# Patient Record
Sex: Female | Born: 1990 | Race: White | Hispanic: No | Marital: Married | Smoking: Never smoker
Health system: Southern US, Community
[De-identification: ages and names within clinical notes are randomized; demographics above are authoritative.]

## PROBLEM LIST (undated history)

## (undated) DIAGNOSIS — L700 Acne vulgaris: Secondary | ICD-10-CM

## (undated) DIAGNOSIS — K219 Gastro-esophageal reflux disease without esophagitis: Secondary | ICD-10-CM

## (undated) DIAGNOSIS — S83519A Sprain of anterior cruciate ligament of unspecified knee, initial encounter: Secondary | ICD-10-CM

## (undated) DIAGNOSIS — F32A Depression, unspecified: Secondary | ICD-10-CM

## (undated) DIAGNOSIS — B019 Varicella without complication: Secondary | ICD-10-CM

## (undated) DIAGNOSIS — Z01419 Encounter for gynecological examination (general) (routine) without abnormal findings: Secondary | ICD-10-CM

## (undated) DIAGNOSIS — F329 Major depressive disorder, single episode, unspecified: Secondary | ICD-10-CM

## (undated) DIAGNOSIS — N946 Dysmenorrhea, unspecified: Secondary | ICD-10-CM

## (undated) HISTORY — DX: Depression, unspecified: F32.A

## (undated) HISTORY — DX: Encounter for gynecological examination (general) (routine) without abnormal findings: Z01.419

## (undated) HISTORY — DX: Varicella without complication: B01.9

## (undated) HISTORY — DX: Major depressive disorder, single episode, unspecified: F32.9

## (undated) HISTORY — DX: Sprain of anterior cruciate ligament of unspecified knee, initial encounter: S83.519A

## (undated) HISTORY — DX: Dysmenorrhea, unspecified: N94.6

## (undated) HISTORY — DX: Gastro-esophageal reflux disease without esophagitis: K21.9

## (undated) HISTORY — DX: Acne vulgaris: L70.0

## (undated) HISTORY — PX: TUBAL LIGATION: SHX77

---
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
## 2008-09-26 ENCOUNTER — Telehealth: Payer: Self-pay | Admitting: Family Medicine

## 2008-10-17 ENCOUNTER — Ambulatory Visit: Payer: Self-pay | Admitting: Family Medicine

## 2008-10-17 DIAGNOSIS — L989 Disorder of the skin and subcutaneous tissue, unspecified: Secondary | ICD-10-CM

## 2008-11-18 ENCOUNTER — Encounter: Payer: Self-pay | Admitting: Family Medicine

## 2009-03-17 ENCOUNTER — Ambulatory Visit: Payer: Self-pay | Admitting: Family Medicine

## 2009-03-17 DIAGNOSIS — B019 Varicella without complication: Secondary | ICD-10-CM | POA: Insufficient documentation

## 2009-06-17 ENCOUNTER — Ambulatory Visit: Payer: Self-pay | Admitting: Family Medicine

## 2009-06-17 DIAGNOSIS — N921 Excessive and frequent menstruation with irregular cycle: Secondary | ICD-10-CM

## 2009-09-21 ENCOUNTER — Ambulatory Visit: Payer: Self-pay | Admitting: Family Medicine

## 2009-09-21 DIAGNOSIS — N390 Urinary tract infection, site not specified: Secondary | ICD-10-CM

## 2009-09-21 LAB — CONVERTED CEMR LAB
Glucose, Urine, Semiquant: NEGATIVE
Specific Gravity, Urine: 1.015
pH: 6.5

## 2009-10-09 ENCOUNTER — Ambulatory Visit: Payer: Self-pay | Admitting: Family Medicine

## 2009-10-09 LAB — CONVERTED CEMR LAB
Nitrite: NEGATIVE
Specific Gravity, Urine: 1.025
WBC Urine, dipstick: NEGATIVE

## 2009-10-13 LAB — CONVERTED CEMR LAB
ALT: 18 units/L (ref 0–35)
AST: 27 units/L (ref 0–37)
Albumin: 3.8 g/dL (ref 3.5–5.2)
Basophils Absolute: 0.1 10*3/uL (ref 0.0–0.1)
Basophils Relative: 1.6 % (ref 0.0–3.0)
CO2: 31 meq/L (ref 19–32)
GFR calc non Af Amer: 122.66 mL/min (ref >60)
Glucose, Bld: 81 mg/dL (ref 70–99)
HCT: 41.7 % (ref 36.0–46.0)
HDL: 97.5 mg/dL (ref >39.00)
Hemoglobin: 14.1 g/dL (ref 12.0–15.0)
Lymphs Abs: 1.6 10*3/uL (ref 0.7–4.0)
Monocytes Relative: 6.2 % (ref 3.0–12.0)
Neutro Abs: 1.8 10*3/uL (ref 1.4–7.7)
Potassium: 4.2 meq/L (ref 3.5–5.1)
RBC: 4.51 M/uL (ref 3.87–5.11)
RDW: 13.3 % (ref 11.5–14.6)
Sodium: 143 meq/L (ref 135–145)
TSH: 1.8 microintl units/mL (ref 0.35–5.50)
Total CHOL/HDL Ratio: 2
Total Protein: 6.7 g/dL (ref 6.0–8.3)

## 2009-11-04 ENCOUNTER — Telehealth: Payer: Self-pay | Admitting: Family Medicine

## 2009-12-28 ENCOUNTER — Ambulatory Visit: Payer: Self-pay | Admitting: Family Medicine

## 2010-02-13 ENCOUNTER — Ambulatory Visit: Payer: Self-pay | Admitting: Diagnostic Radiology

## 2010-02-13 ENCOUNTER — Emergency Department (HOSPITAL_BASED_OUTPATIENT_CLINIC_OR_DEPARTMENT_OTHER): Admission: EM | Admit: 2010-02-13 | Discharge: 2010-02-13 | Payer: Self-pay | Admitting: Emergency Medicine

## 2010-02-15 ENCOUNTER — Ambulatory Visit: Payer: Self-pay | Admitting: Family Medicine

## 2010-02-15 DIAGNOSIS — S1093XA Contusion of unspecified part of neck, initial encounter: Secondary | ICD-10-CM

## 2010-02-15 DIAGNOSIS — S060X0A Concussion without loss of consciousness, initial encounter: Secondary | ICD-10-CM | POA: Insufficient documentation

## 2010-02-15 DIAGNOSIS — S0003XA Contusion of scalp, initial encounter: Secondary | ICD-10-CM | POA: Insufficient documentation

## 2010-02-15 DIAGNOSIS — S0083XA Contusion of other part of head, initial encounter: Secondary | ICD-10-CM

## 2010-03-01 ENCOUNTER — Telehealth: Payer: Self-pay | Admitting: Family Medicine

## 2010-03-02 ENCOUNTER — Ambulatory Visit: Payer: Self-pay | Admitting: Family Medicine

## 2010-03-05 LAB — CONVERTED CEMR LAB
Basophils Absolute: 0 10*3/uL (ref 0.0–0.1)
EBV VCA IgM: 2.13 — ABNORMAL HIGH
Eosinophils Absolute: 0 10*3/uL (ref 0.0–0.7)
Lymphocytes Relative: 45.5 % (ref 12.0–46.0)
MCHC: 34.2 g/dL (ref 30.0–36.0)
MCV: 89.4 fL (ref 78.0–100.0)
Monocytes Absolute: 0.6 10*3/uL (ref 0.1–1.0)
Neutrophils Relative %: 42.2 % — ABNORMAL LOW (ref 43.0–77.0)
Platelets: 170 10*3/uL (ref 150.0–400.0)
RBC: 4.29 M/uL (ref 3.87–5.11)
RDW: 13.1 % (ref 11.5–14.6)

## 2010-03-09 ENCOUNTER — Telehealth: Payer: Self-pay | Admitting: Family Medicine

## 2010-06-08 ENCOUNTER — Ambulatory Visit: Admit: 2010-06-08 | Payer: Self-pay | Admitting: Family Medicine

## 2010-06-12 ENCOUNTER — Encounter: Payer: Self-pay | Admitting: Family Medicine

## 2010-06-15 NOTE — Assessment & Plan Note (Signed)
Summary: med check/refills/acne/cjr   Vital Signs:  Patient profile:   20 year old female Weight:      102 pounds Temp:     98.0 degrees F oral Pulse rate:   73 / minute BP sitting:   114 / 72  (left arm) Cuff size:   regular  Vitals Entered By: Alfred Levins, CMA (June 17, 2009 9:54 AM) CC: discuss meds   History of Present Illness: Here to discuss her BCP. We had started her on Tri-Sprintec several years ago for dysmenorrhea, and it worked well for a time. Last July she was established with a GYN in [facility] named Dr. Christella Hartigan, and she had a Pap smear, etc. Dr. Christella Hartigan told her to stay on the pill at that time. Now for a few months she has had some breakthrough bleeding mid way through her cycles, and she wants to switch to a new BCP. Otherwise she is doing well in college, and last semester she got straight As.   Current Medications (verified): 1)  Tri-Sprintec 0.035 Mg  Tabs (Norgestimate-Ethinyl Estradiol) .Marland Kitchen.. 1 By Mouth Once Daily 2)  Benzaclin 1-5 %  Gel (Clindamycin Phos-Benzoyl Perox) .... Apply Once Daily 3)  Elidel 1 %  Crea (Pimecrolimus) .... Apply Two Times A Day As Needed Seborrhea  Allergies (verified): 1)  ! Sulfa  Past History:  Past Medical History: Reviewed history from 03/17/2009 and no changes required. Depression GERD acne vulgaris dysmenorrhea torn ligament left foot 2008, saw Dr. Lestine Box sees Dr. Christella Hartigan in [facility] for GYN exams chickenpox several times  Past Surgical History: Reviewed history from 04/06/2007 and no changes required. Denies surgical history  Review of Systems  The patient denies anorexia, fever, weight loss, weight gain, vision loss, decreased hearing, hoarseness, chest pain, syncope, dyspnea on exertion, peripheral edema, prolonged cough, headaches, hemoptysis, abdominal pain, melena, hematochezia, severe indigestion/heartburn, hematuria, incontinence, genital sores, muscle weakness, suspicious skin lesions, transient blindness,  difficulty walking, depression, unusual weight change, abnormal bleeding, enlarged lymph nodes, angioedema, breast masses, and testicular masses.    Physical Exam  General:  Well-developed,well-nourished,in no acute distress; alert,appropriate and cooperative throughout examination Psych:  Cognition and judgment appear intact. Alert and cooperative with normal attention span and concentration. No apparent delusions, illusions, hallucinations   Impression & Recommendations:  Problem # 1:  METRORRHAGIA (ICD-626.6)  Problem # 2:  DEPRESSION (ICD-311)  Complete Medication List: 1)  Tri-sprintec 0.035 Mg Tabs (Norgestimate-ethinyl estradiol) .Marland Kitchen.. 1 by mouth once daily 2)  Benzaclin 1-5 % Gel (Clindamycin phos-benzoyl perox) .... Apply once daily 3)  Elidel 1 % Crea (Pimecrolimus) .... Apply two times a day as needed seborrhea  Patient Instructions: 1)  She needs to discuss this with her GYN.  2)  Please schedule a follow-up appointment as needed .

## 2010-06-15 NOTE — Progress Notes (Signed)
Summary: rx minocycline   Phone Note From Pharmacy   Caller: [facility]  Summary of Call: minocycline 100mg  requesting 90 days  Initial call taken by: Pura Spice, RN,  March 09, 2010 5:01 PM  Follow-up for Phone Call        call in #180 with 3 rf Follow-up by: Nelwyn Salisbury MD,  March 10, 2010 8:24 AM  Additional Follow-up for Phone Call Additional follow up Details #1::        faxed to [facility]  Additional Follow-up by: Pura Spice, RN,  March 10, 2010 9:23 AM    Prescriptions: MINOCYCLINE HCL 100 MG CAPS (MINOCYCLINE HCL) two times a day  #180 x 3   Entered by:   Pura Spice, RN   Authorized by:   Nelwyn Salisbury MD   Signed by:   Pura Spice, RN on 03/10/2010   Method used:   Faxed to ...       [facility] (mail-order)             , [place]         Ph: [phone]       Fax: [phone]   RxID:   [id number]
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
## 2010-06-15 NOTE — Assessment & Plan Note (Signed)
Summary: cpx/njr   Vital Signs:  Patient profile:   20 year old female Height:      63 inches Weight:      104 pounds BMI:     18.49 BP sitting:   80 / 50  (left arm) Cuff size:   regular  Vitals Entered By: Raechel Ache, RN (December 28, 2009 1:25 PM) CC: CPX, labs done. Sees gyn.   History of Present Illness: 20 yr old female for a cpx. She feels fine but has questions about her acne. She has been using Benzaclin cream for 4 years, and she want to try something new. This tends to dry out her skin too much.   Allergies: 1)  ! Sulfa  Past History:  Past Medical History: Reviewed history from 03/17/2009 and no changes required. Depression GERD acne vulgaris dysmenorrhea torn ligament left foot 2008, saw Dr. Lestine Box sees Dr. Christella Hartigan in [facility] for GYN exams chickenpox several times  Past Surgical History: Reviewed history from 04/06/2007 and no changes required. Denies surgical history  Family History: Reviewed history from 10/16/2007 and no changes required. allergies Family History Depression  Social History: Reviewed history from 10/16/2007 and no changes required. Single Never Smoked Alcohol use-no Drug use-no  Review of Systems  The patient denies anorexia, fever, weight loss, weight gain, vision loss, decreased hearing, hoarseness, chest pain, syncope, dyspnea on exertion, peripheral edema, prolonged cough, headaches, hemoptysis, abdominal pain, melena, hematochezia, severe indigestion/heartburn, hematuria, incontinence, genital sores, muscle weakness, suspicious skin lesions, transient blindness, difficulty walking, depression, unusual weight change, abnormal bleeding, enlarged lymph nodes, angioedema, breast masses, and testicular masses.    Physical Exam  General:  Well-developed,well-nourished,in no acute distress; alert,appropriate and cooperative throughout examination Head:  Normocephalic and atraumatic without obvious abnormalities. No apparent alopecia  or balding. Eyes:  No corneal or conjunctival inflammation noted. EOMI. Perrla. Funduscopic exam benign, without hemorrhages, exudates or papilledema. Vision grossly normal. Ears:  External ear exam shows no significant lesions or deformities.  Otoscopic examination reveals clear canals, tympanic membranes are intact bilaterally without bulging, retraction, inflammation or discharge. Hearing is grossly normal bilaterally. Nose:  External nasal examination shows no deformity or inflammation. Nasal mucosa are pink and moist without lesions or exudates. Mouth:  Oral mucosa and oropharynx without lesions or exudates.  Teeth in good repair. Neck:  No deformities, masses, or tenderness noted. Chest Wall:  No deformities, masses, or tenderness noted. Lungs:  Normal respiratory effort, chest expands symmetrically. Lungs are clear to auscultation, no crackles or wheezes. Heart:  Normal rate and regular rhythm. S1 and S2 normal without gallop, murmur, click, rub or other extra sounds. Abdomen:  Bowel sounds positive,abdomen soft and non-tender without masses, organomegaly or hernias noted. Msk:  No deformity or scoliosis noted of thoracic or lumbar spine.   Pulses:  R and L carotid,radial,femoral,dorsalis pedis and posterior tibial pulses are full and equal bilaterally Extremities:  No clubbing, cyanosis, edema, or deformity noted with normal full range of motion of all joints.   Neurologic:  No cranial nerve deficits noted. Station and gait are normal. Plantar reflexes are down-going bilaterally. DTRs are symmetrical throughout. Sensory, motor and coordinative functions appear intact. Skin:  clear except some mild papular acne over the face  Cervical Nodes:  No lymphadenopathy noted Axillary Nodes:  No palpable lymphadenopathy Inguinal Nodes:  No significant adenopathy Psych:  Cognition and judgment appear intact. Alert and cooperative with normal attention span and concentration. No apparent delusions,  illusions, hallucinations   Impression & Recommendations:  Problem # 1:  WELL ADULT EXAM (ICD-V70.0)  Complete Medication List: 1)  Zenchent 0.4-35 Mg-mcg Tabs (Norethindrone-eth estradiol) .Marland Kitchen.. 1 once daily 2)  Minocycline Hcl 100 Mg Caps (Minocycline hcl) .... Two times a day  Other Orders: Hepatitis A Vaccine (Adult Dose) (16109) Admin 1st Vaccine (60454)  Patient Instructions: 1)  Switch to oral Minocycline. Given a Hep. A shot.  2)  Please schedule a follow-up appointment in 6 months .  Prescriptions: MINOCYCLINE HCL 100 MG CAPS (MINOCYCLINE HCL) two times a day  #60 x 11   Entered and Authorized by:   Nelwyn Salisbury MD   Signed by:   Nelwyn Salisbury MD on 12/28/2009   Method used:   Electronically to        [facility] [phone]* (retail)       [address] [place], [place]  [address]       Ph: [phone]       Fax: [phone]   RxID:   [id number]    Immunization History:  Varicella Immunization History:    History of chickenpox:  yes (02/13/2009)  Immunizations Administered:  Hepatitis A Vaccine # 1:    Vaccine Type: HepA    Site: right deltoid    Mfr: GlaxoSmithKline    Dose: 0.5 ml    Route: IM    Given by: Raechel Ache, RN    Exp. Date: 09/02/2011    Lot #: [id number]    VIS given: 08/03/04 version given December 28, 2009.

## 2010-06-15 NOTE — Letter (Signed)
Summary: Excuse from Classes due to Illness  Excuse from Classes due to Illness   Imported By: Maryln Gottron 03/04/2010 10:19:41  _____________________________________________________________________  External Attachment:    Type:   Image     Comment:   External Document
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
## 2010-06-15 NOTE — Letter (Signed)
Summary: Return to Work/School  Return to Work/School   Imported By: Maryln Gottron 02/18/2010 09:44:39  _____________________________________________________________________  External Attachment:    Type:   Image     Comment:   External Document

## 2010-06-15 NOTE — Progress Notes (Signed)
Summary: no show for CPX  Phone Note Other Incoming   Summary of Call: no show for CPX Initial call taken by: Raechel Ache, RN,  November 04, 2009 10:27 AM  Follow-up for Phone Call        noted Follow-up by: Nelwyn Salisbury MD,  November 04, 2009 11:50 AM

## 2010-07-02 ENCOUNTER — Ambulatory Visit (INDEPENDENT_AMBULATORY_CARE_PROVIDER_SITE_OTHER): Payer: Self-pay | Admitting: Family Medicine

## 2010-07-02 DIAGNOSIS — Z0000 Encounter for general adult medical examination without abnormal findings: Secondary | ICD-10-CM

## 2010-07-02 DIAGNOSIS — Z23 Encounter for immunization: Secondary | ICD-10-CM

## 2010-07-06 ENCOUNTER — Ambulatory Visit: Payer: Self-pay | Admitting: Family Medicine

## 2010-10-01 NOTE — Assessment & Plan Note (Signed)
[facility] OFFICE NOTE   NAME:Bonnie Gutierrez, Bonnie Gutierrez                  MRN:          [id number]  DATE:08/10/2006                            DOB:          1990-10-29    This is a 20 year old girl here with her mother for a complete physical  examination.  In general, she is doing well, but does have one  complaint, and that is with her skin.  She has had mild facial acne for  the past 3 years, but over the past 6 months, it has become more of a  problem for her.  She is cleansing with over-the-counter acne cleansers  twice daily.  Since changing her diet a bit from a year ago, she no  longer has problems with acid reflux at all.  She no longer has problems  with depression or grief (the whole family is dealing with the death of  a relative late last year as noted in the chart).  She is excited  because in June she will be spending a week in [place] with her father on  a mission truck.   PAST MEDICAL HISTORY/FAMILY HISTORY/SOCIAL HISTORY/HABITS, ETC.:  For  further details, I refer you to our last physical note dated September 07, 2005.   ALLERGIES:  SULFA.   CURRENT MEDICATIONS:  1. Ortho Tri-Cyclen daily.  2. Multivitamin daily.   OBJECTIVE:  VITAL SIGNS:  Height 5 feet, 3 inches.  Weight 100.  Blood  pressure 94/62, pulse 80 and regular.  GENERAL:  She appears to be healthy.  SKIN:  Clear, except for some mild follicular acne on the forehead and  the face.  HEENT:  Eyes - clear sclerae.  Oropharynx clear.  NECK:  Supple without lymphadenopathy or masses.  LUNGS:  Clear.  CARDIAC:  Rate and rhythm regular without gallops, murmurs or rubs.  Distals pulses are full.  ABDOMEN:  Soft, normal bowel sounds, nontender, no masses.  EXTREMITIES:  No clubbing, cyanosis, or edema.  NEUROLOGIC:  Grossly intact.   ASSESSMENT AND PLAN:  1. Complete physical.  She seems to be doing quite well.  2. History of grief  reaction and depression, now resolved.  3. Gastroesophageal reflux disease, controlled with diet alone.  4. Facial acne.  Will begin BenzaClin gel b.i.d.     Tera Mater. Clent Ridges, MD  Electronically Signed   SAF/MedQ  DD: 08/10/2006  DT: 08/10/2006  Job #: [id number]

## 2011-04-01 ENCOUNTER — Telehealth: Payer: Self-pay | Admitting: Family Medicine

## 2011-04-01 NOTE — Telephone Encounter (Signed)
Pt was in [facility] this a.m on 04/01/11. Pt now has back and neck pain and a pinching feeling in upper back. Pt is req an ov to be seen or advice as to what she should do. Pls call.

## 2011-04-01 NOTE — Telephone Encounter (Signed)
Pt will go to the ER if she feels no better this weekend for xrays and evaluations.

## 2011-04-01 NOTE — Telephone Encounter (Signed)
agreed

## 2011-04-25 ENCOUNTER — Ambulatory Visit (INDEPENDENT_AMBULATORY_CARE_PROVIDER_SITE_OTHER): Payer: 59 | Admitting: Family Medicine

## 2011-04-25 DIAGNOSIS — Z0000 Encounter for general adult medical examination without abnormal findings: Secondary | ICD-10-CM

## 2011-04-30 IMAGING — CR DG NASAL BONES 3+V
3 series · 3 of 3 positions shown · non-contrast
Comparison: None.

CLINICAL DATA: Nasal injury.  Nasal pain and swelling.

NASAL BONES - 3+ VIEW

[w waters *]
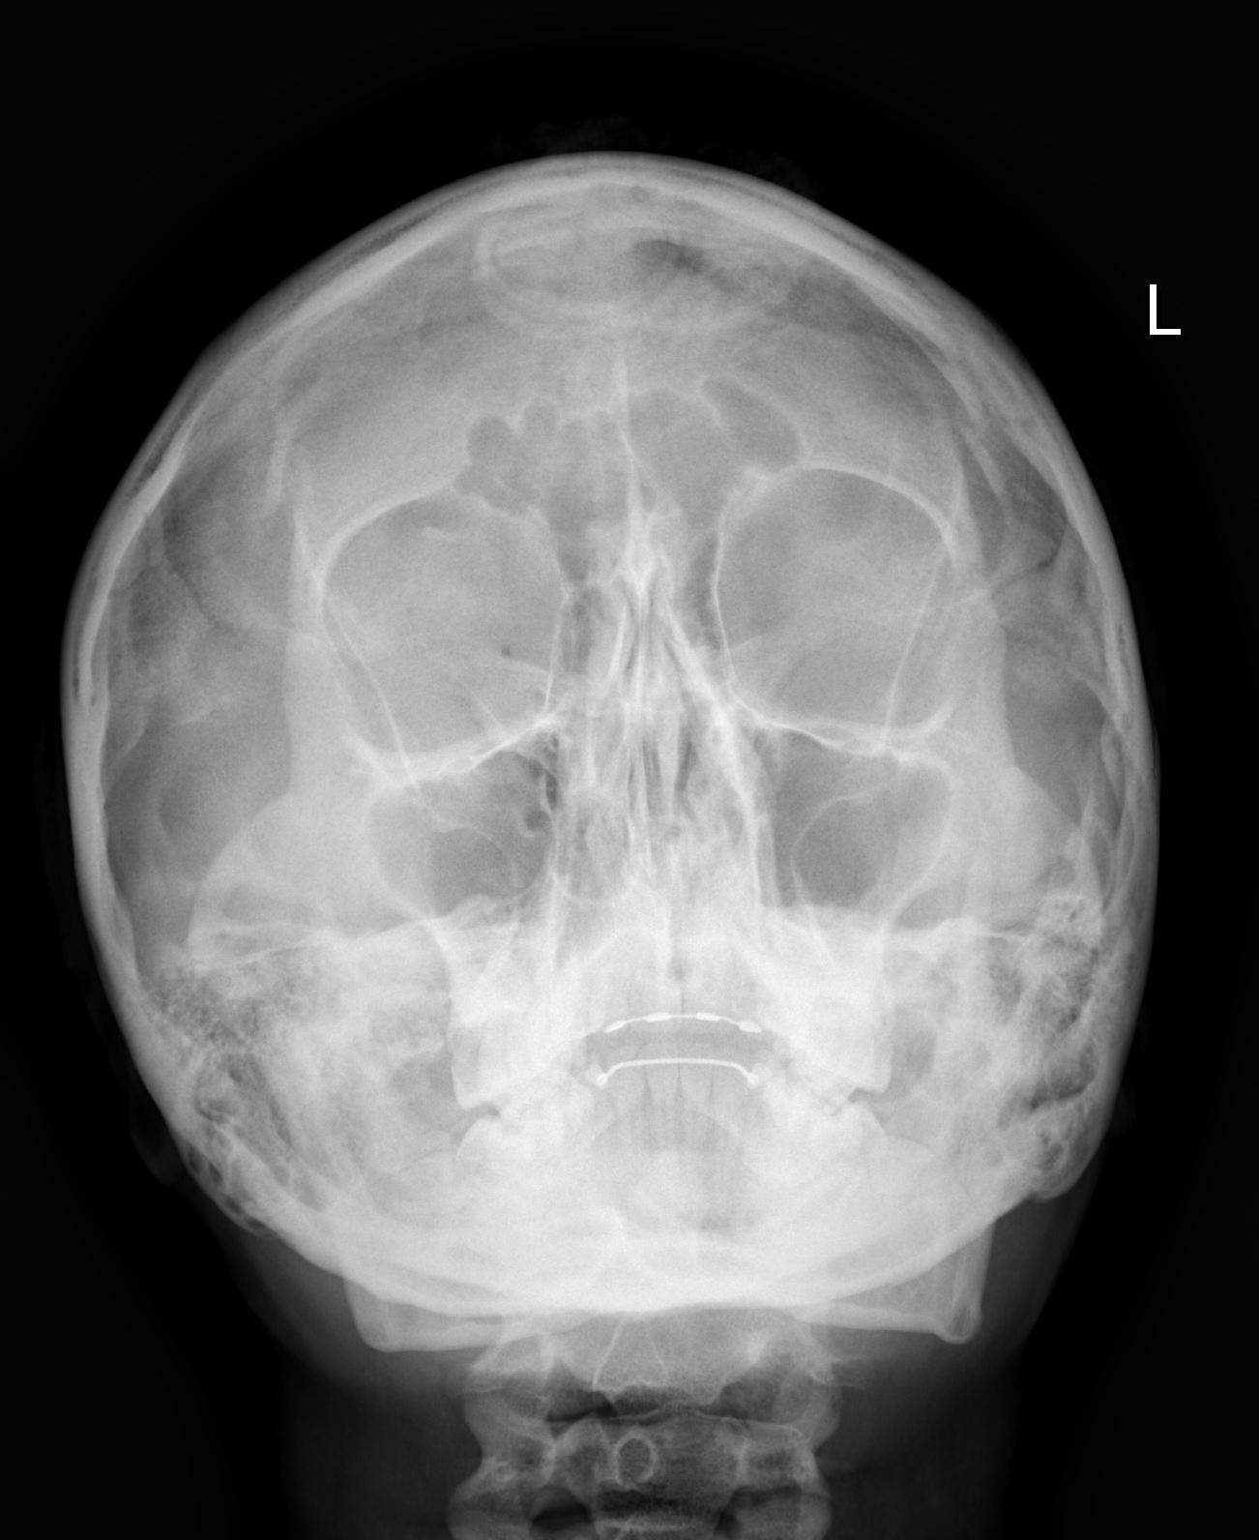

[w nasal bone lat * (1 of 2)]
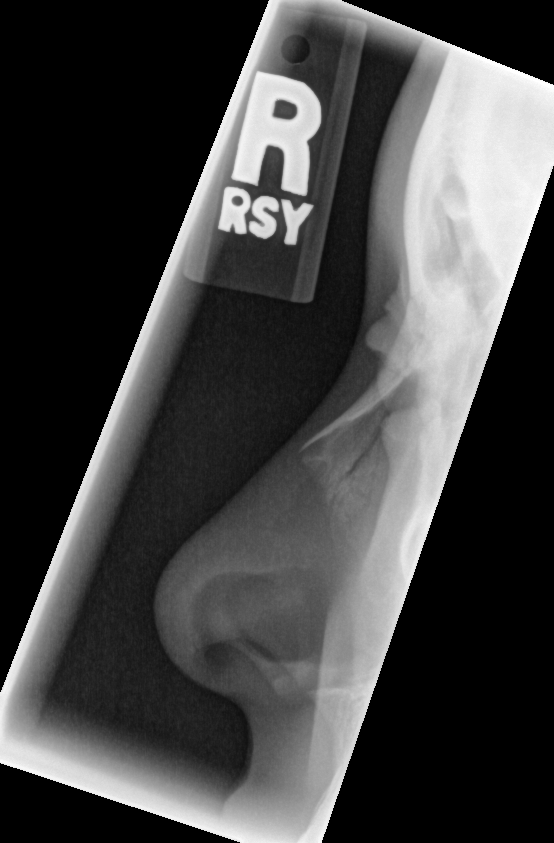

[w nasal bone lat * (2 of 2)]
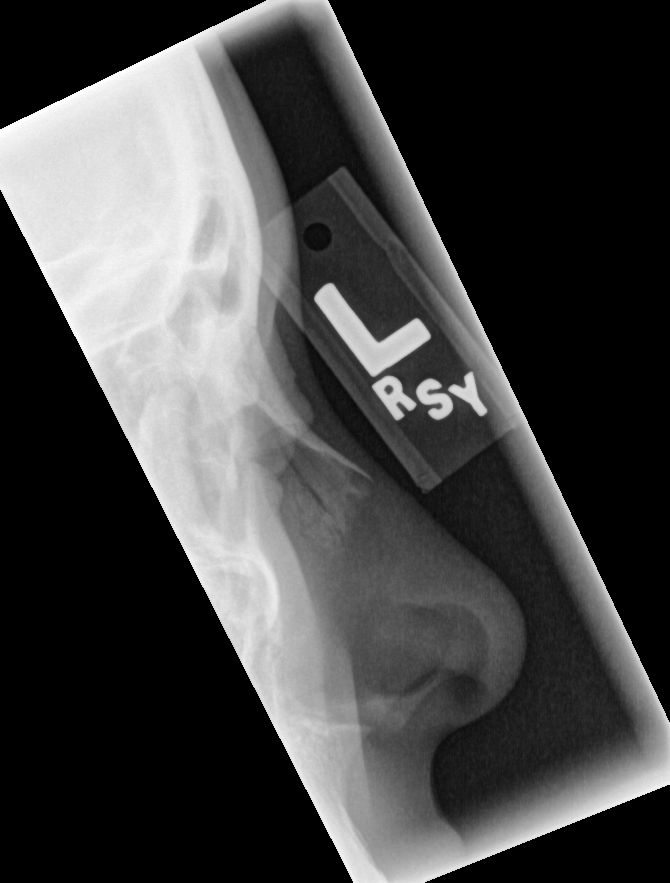

[3 of 3 positions shown; findings below may reference images not displayed]

FINDINGS: There is no evidence of fracture or other bone
abnormality.
IMPRESSION: Negative.

## 2011-07-12 ENCOUNTER — Encounter: Payer: Self-pay | Admitting: Family Medicine

## 2011-07-12 ENCOUNTER — Ambulatory Visit (INDEPENDENT_AMBULATORY_CARE_PROVIDER_SITE_OTHER): Payer: BC Managed Care – PPO | Admitting: Family Medicine

## 2011-07-12 VITALS — BP 98/62 | HR 76 | Temp 98.7°F | Wt 114.0 lb

## 2011-07-12 DIAGNOSIS — J329 Chronic sinusitis, unspecified: Secondary | ICD-10-CM

## 2011-07-12 MED ORDER — AZITHROMYCIN 250 MG PO TABS: ORAL_TABLET | ORAL | Status: AC

## 2011-07-12 NOTE — Progress Notes (Signed)
  Subjective:    Patient ID: Bonnie Gutierrez, female    DOB: 1991/02/10, 21 y.o.   MRN: [id number]  HPI Here for 10 days of sinus pressure, PND, HA, ST,and a dry cough. No fever.    Review of Systems  Constitutional: Negative.   HENT: Positive for congestion and postnasal drip.   Eyes: Negative.   Respiratory: Positive for cough.        Objective:   Physical Exam  Constitutional: She appears well-developed and well-nourished.  HENT:  Right Ear: External ear normal.  Left Ear: External ear normal.  Nose: Nose normal.  Mouth/Throat: Oropharynx is clear and moist. No oropharyngeal exudate.  Eyes: Conjunctivae are normal.  Pulmonary/Chest: Breath sounds normal. No respiratory distress. She has no wheezes. She has no rales.  Lymphadenopathy:    She has no cervical adenopathy.          Assessment & Plan:  Add Mucinex prn

## 2011-08-08 ENCOUNTER — Telehealth: Payer: Self-pay | Admitting: Family Medicine

## 2011-08-08 MED ORDER — PREDNISONE (PAK) 10 MG PO TABS: 10.0000 mg | ORAL_TABLET | Freq: Every day | ORAL | Status: AC

## 2011-08-08 NOTE — Telephone Encounter (Signed)
Pt is allergic to sulfur and used a face cream on Thursday that had sulfur in it and broke out in hives all over her face. Pt has been using benadryl since Friday and it is not helping.

## 2011-08-08 NOTE — Telephone Encounter (Signed)
Using hydrocortisone cream and Benadryl 25 mg. q 6 hours with no improvements.  Hives on face, chest and neck.

## 2011-08-08 NOTE — Telephone Encounter (Signed)
Script sent e-scribe and spoke with pt. 

## 2011-08-08 NOTE — Telephone Encounter (Signed)
Call in a 12 days taper pack of prednisone 10 mg tablets, #48

## 2012-04-30 ENCOUNTER — Ambulatory Visit: Payer: BC Managed Care – PPO | Admitting: Family Medicine

## 2012-04-30 DIAGNOSIS — J111 Influenza due to unidentified influenza virus with other respiratory manifestations: Secondary | ICD-10-CM

## 2012-05-07 ENCOUNTER — Encounter: Payer: Self-pay | Admitting: Family Medicine

## 2012-05-07 ENCOUNTER — Ambulatory Visit (INDEPENDENT_AMBULATORY_CARE_PROVIDER_SITE_OTHER): Payer: BC Managed Care – PPO | Admitting: Family Medicine

## 2012-05-07 VITALS — BP 124/80 | HR 113 | Temp 99.2°F | Wt 116.0 lb

## 2012-05-07 DIAGNOSIS — B9789 Other viral agents as the cause of diseases classified elsewhere: Secondary | ICD-10-CM

## 2012-05-07 DIAGNOSIS — B349 Viral infection, unspecified: Secondary | ICD-10-CM

## 2012-05-07 MED ORDER — ALBUTEROL SULFATE (5 MG/ML) 0.5% IN NEBU
2.5000 mg | INHALATION_SOLUTION | RESPIRATORY_TRACT | Status: AC
  Administered 2012-05-07: 2.5 mg via RESPIRATORY_TRACT

## 2012-05-07 MED ORDER — BENZONATATE 100 MG PO CAPS: 100.0000 mg | ORAL_CAPSULE | Freq: Two times a day (BID) | ORAL | Status: DC | PRN

## 2012-05-07 MED ORDER — ALBUTEROL SULFATE HFA 108 (90 BASE) MCG/ACT IN AERS: 2.0000 | INHALATION_SPRAY | Freq: Four times a day (QID) | RESPIRATORY_TRACT | Status: DC | PRN

## 2012-05-07 NOTE — Patient Instructions (Addendum)
-  please drink plenty of fluids  -use humidifier at night  -use cough medication if needed and albuterol if needed and if helps  -follow up with trouble breathing or not feeling better in 1 week

## 2012-05-07 NOTE — Progress Notes (Signed)
Chief Complaint  Patient presents with  . Cough    HPI:  Acute visit for cough/flu: -started: 1 week ago, dx with flu based on symptom and started on tamiflu and codeine -ran out of cough syrup -symptoms:fever went away, but cough as remained -denies:fever, SOB, NVD, tooth pain -has tried: tamiflu -sick contacts: none know -Hx of:had flu recently, no hx asthma  ROS: See pertinent positives and negatives per HPI.  Past Medical History  Diagnosis Date  . Depression   . GERD (gastroesophageal reflux disease)   . Acne vulgaris   . Dysmenorrhea   . Torn ACL (anterior cruciate ligament)     left foot Dr Lestine Box  2008  . Chicken pox     Family History  Problem Relation Age of Onset  . Allergies      family hx  . Depression      family hx    History   Social History  . Marital Status: Single    Spouse Name: N/A    Number of Children: N/A  . Years of Education: N/A   Social History Main Topics  . Smoking status: Never Smoker   . Smokeless tobacco: Never Used  . Alcohol Use: Yes     Comment: once a month or less  . Drug Use: No  . Sexually Active: None   Other Topics Concern  . None   Social History Narrative  . None    Current outpatient prescriptions:Multiple Vitamin (MULTIVITAMIN) tablet, Take 1 tablet by mouth daily., Disp: , Rfl: ;  norethindrone-ethinyl estradiol (OVCON) 0.4-35 MG-MCG per tablet, Take 1 tablet by mouth daily.  , Disp: , Rfl: ;  albuterol (PROVENTIL HFA;VENTOLIN HFA) 108 (90 BASE) MCG/ACT inhaler, Inhale 2 puffs into the lungs every 6 (six) hours as needed for wheezing., Disp: 1 Inhaler, Rfl: 0 benzonatate (TESSALON) 100 MG capsule, Take 1 capsule (100 mg total) by mouth 2 (two) times daily as needed for cough., Disp: 20 capsule, Rfl: 0  EXAM:  Filed Vitals:   05/07/12 1446  BP:   Pulse: 113  Temp:    O2 94% There is no height on file to calculate BMI.  GENERAL: vitals reviewed and listed above, alert, oriented, appears well  hydrated and in no acute distress  HEENT: atraumatic, conjunttiva clear, no obvious abnormalities on inspection of external nose and ears  NECK: no obvious masses on inspection  LUNGS: clear to auscultation bilaterally, no wheezes, rales or rhonchi, good air movement  CV: HRRR, no peripheral edema  MS: moves all extremities without noticeable abnormality  PSYCH: pleasant and cooperative, no obvious depression or anxiety  ASSESSMENT AND PLAN:  Discussed the following assessment and plan:  1. Viral illness    -pt initially coughing so much when pulse ox taken and in low nineties, nursing staff gave nebulizer tx and pt felt much better with less coughing and O2 sats normal -lung clear on exam, advised on typically course of flu and return precuations -pt doesn't want more codeine as made her heart rate go up - tessalon and alb provided to use prn -Patient advised to return or notify a doctor immediately if symptoms worsen or persist or new concerns arise.  Patient Instructions  -please drink plenty of fluids  -use humidifier at night  -use cough medication if needed and albuterol if needed and if helps  -follow up with trouble breathing or not feeling better in 1 week     KIM, HANNAH R.

## 2012-05-25 ENCOUNTER — Encounter: Payer: Self-pay | Admitting: Family Medicine

## 2012-05-25 ENCOUNTER — Ambulatory Visit (INDEPENDENT_AMBULATORY_CARE_PROVIDER_SITE_OTHER): Payer: BC Managed Care – PPO | Admitting: Family Medicine

## 2012-05-25 VITALS — BP 124/70 | HR 118 | Temp 98.6°F | Wt 112.0 lb

## 2012-05-25 DIAGNOSIS — J069 Acute upper respiratory infection, unspecified: Secondary | ICD-10-CM

## 2012-05-25 MED ORDER — METHYLPREDNISOLONE 4 MG PO KIT: PACK | ORAL | Status: AC

## 2012-05-25 NOTE — Progress Notes (Signed)
  Subjective:    Patient ID: Bonnie Gutierrez, female    DOB: Sep 14, 1990, 22 y.o.   MRN: [id number]  HPI Here for a stubborn cough that has bothered her for 4 weeks now. She was seen here several weeks ago and was told she had a viral URI. She has been taking OTC decongestants and cough meds with mixed results. She is using a Ventolin inhaler several times a day. This helps a great deal but lasts only an hour or so. The cough is dry, non-productive, and she has soft wheezes at times. No fever.    Review of Systems  Constitutional: Negative.   HENT: Negative.   Eyes: Negative.   Respiratory: Positive for cough, shortness of breath and wheezing.   Cardiovascular: Negative.        Objective:   Physical Exam  Constitutional: She appears well-developed and well-nourished.  HENT:  Right Ear: External ear normal.  Left Ear: External ear normal.  Nose: Nose normal.  Mouth/Throat: Oropharynx is clear and moist.  Eyes: Conjunctivae normal are normal.  Neck: No thyromegaly present.  Pulmonary/Chest: Effort normal and breath sounds normal. No respiratory distress. She has no wheezes. She has no rales.  Lymphadenopathy:    She has no cervical adenopathy.          Assessment & Plan:  Viral URI. Try a Medrol dose pack to reduce airway inflammation. This should resolve soon

## 2012-11-05 ENCOUNTER — Ambulatory Visit (INDEPENDENT_AMBULATORY_CARE_PROVIDER_SITE_OTHER): Payer: BC Managed Care – PPO | Admitting: Family Medicine

## 2012-11-05 ENCOUNTER — Encounter: Payer: Self-pay | Admitting: Family Medicine

## 2012-11-05 VITALS — BP 110/64 | HR 103 | Temp 98.2°F | Wt 119.0 lb

## 2012-11-05 DIAGNOSIS — O0001 Abdominal pregnancy with intrauterine pregnancy: Secondary | ICD-10-CM

## 2012-11-05 LAB — POCT URINALYSIS DIPSTICK
Blood, UA: NEGATIVE
pH, UA: 6

## 2012-11-05 NOTE — Addendum Note (Signed)
Addended by: Aniceto Boss A on: 11/05/2012 11:44 AM   Modules accepted: Orders

## 2012-11-05 NOTE — Progress Notes (Signed)
  Subjective:    Patient ID: Bonnie Gutierrez, female    DOB: November 26, 1990, 22 y.o.   MRN: [id number]  HPI Here to check a pregnancy test. She is here with her boyfriend. Her LMP was 10-05-12. She feels fine although her breasts are a little tender. She has had a positive test at home. She does not use alcohol or tobacco.    Review of Systems  Constitutional: Negative.   Respiratory: Negative.   Cardiovascular: Negative.        Objective:   Physical Exam  Constitutional: She appears well-developed and well-nourished.  Cardiovascular: Normal rate, regular rhythm, normal heart sounds and intact distal pulses.   Pulmonary/Chest: Effort normal and breath sounds normal.          Assessment & Plan:  Positive pregnancy. She has already made an appt to see her ObGyn on 11-28-12. She will start on prenatal vitamins.

## 2013-02-13 ENCOUNTER — Encounter: Payer: Self-pay | Admitting: Family Medicine

## 2013-02-13 ENCOUNTER — Ambulatory Visit (INDEPENDENT_AMBULATORY_CARE_PROVIDER_SITE_OTHER): Payer: BC Managed Care – PPO | Admitting: Family Medicine

## 2013-02-13 VITALS — BP 100/60 | HR 87 | Temp 98.6°F | Wt 128.0 lb

## 2013-02-13 DIAGNOSIS — L0292 Furuncle, unspecified: Secondary | ICD-10-CM

## 2013-02-13 NOTE — Progress Notes (Signed)
  Subjective:    Patient ID: Bonnie Gutierrez, female    DOB: 1990/08/17, 22 y.o.   MRN: [id number]  HPI Here for 3 days of a red lesion on the chest. Yesterday it became tender and swelled up, then it opened and drained yellow fluid. Today it looks and feels much better and appears to be going away. Applying Neosporin ointment. No fever. She is [redacted] weeks pregnant, and her pregnancy is going very well.    Review of Systems  Constitutional: Negative.        Objective:   Physical Exam  Constitutional: She appears well-developed and well-nourished.  Skin:  The left upper chest has a slightly raised red papular lesion that has scabbed over. It is not warm or tender          Assessment & Plan:  Small boil that seems to be resolving on its own. Continue Neosporin. Recheck if it gets worse again

## 2014-02-21 ENCOUNTER — Ambulatory Visit (INDEPENDENT_AMBULATORY_CARE_PROVIDER_SITE_OTHER): Payer: BC Managed Care – PPO | Admitting: Family Medicine

## 2014-02-21 DIAGNOSIS — Z23 Encounter for immunization: Secondary | ICD-10-CM

## 2014-03-17 ENCOUNTER — Encounter: Payer: Self-pay | Admitting: Family Medicine

## 2014-03-26 ENCOUNTER — Encounter: Payer: Self-pay | Admitting: Family Medicine

## 2014-03-26 ENCOUNTER — Ambulatory Visit (INDEPENDENT_AMBULATORY_CARE_PROVIDER_SITE_OTHER): Payer: BC Managed Care – PPO | Admitting: Family Medicine

## 2014-03-26 VITALS — BP 99/57 | HR 54 | Temp 98.5°F | Ht 63.0 in | Wt 134.0 lb

## 2014-03-26 DIAGNOSIS — Z0000 Encounter for general adult medical examination without abnormal findings: Secondary | ICD-10-CM

## 2014-03-26 LAB — CBC WITH DIFFERENTIAL/PLATELET
Basophils Absolute: 0 10*3/uL (ref 0.0–0.1)
Basophils Relative: 0.6 % (ref 0.0–3.0)
EOS ABS: 0.1 10*3/uL (ref 0.0–0.7)
Eosinophils Relative: 0.8 % (ref 0.0–5.0)
HCT: 43.3 % (ref 36.0–46.0)
Hemoglobin: 14.4 g/dL (ref 12.0–15.0)
Lymphocytes Relative: 29.9 % (ref 12.0–46.0)
Lymphs Abs: 2 10*3/uL (ref 0.7–4.0)
MCHC: 33.3 g/dL (ref 30.0–36.0)
MCV: 87.3 fl (ref 78.0–100.0)
MONO ABS: 0.4 10*3/uL (ref 0.1–1.0)
Monocytes Relative: 6.7 % (ref 3.0–12.0)
NEUTROS PCT: 62 % (ref 43.0–77.0)
Neutro Abs: 4.1 10*3/uL (ref 1.4–7.7)
PLATELETS: 212 10*3/uL (ref 150.0–400.0)
RBC: 4.96 Mil/uL (ref 3.87–5.11)
RDW: 12.9 % (ref 11.5–15.5)
WBC: 6.6 10*3/uL (ref 4.0–10.5)

## 2014-03-26 LAB — TSH: TSH: 0.98 u[IU]/mL (ref 0.35–4.50)

## 2014-03-26 LAB — HEPATIC FUNCTION PANEL
ALBUMIN: 3.6 g/dL (ref 3.5–5.2)
ALK PHOS: 65 U/L (ref 39–117)
ALT: 16 U/L (ref 0–35)
AST: 21 U/L (ref 0–37)
Bilirubin, Direct: 0.1 mg/dL (ref 0.0–0.3)
TOTAL PROTEIN: 7.4 g/dL (ref 6.0–8.3)
Total Bilirubin: 0.7 mg/dL (ref 0.2–1.2)

## 2014-03-26 LAB — POCT URINALYSIS DIPSTICK
Bilirubin, UA: NEGATIVE
GLUCOSE UA: NEGATIVE
Leukocytes, UA: NEGATIVE
NITRITE UA: NEGATIVE
PH UA: 5.5
PROTEIN UA: NEGATIVE
Spec Grav, UA: 1.01
UROBILINOGEN UA: 0.2

## 2014-03-26 LAB — BASIC METABOLIC PANEL
BUN: 10 mg/dL (ref 6–23)
CALCIUM: 9.2 mg/dL (ref 8.4–10.5)
CO2: 25 meq/L (ref 19–32)
Chloride: 105 mEq/L (ref 96–112)
Creatinine, Ser: 0.8 mg/dL (ref 0.4–1.2)
GFR: 92.77 mL/min (ref >60.00)
GLUCOSE: 87 mg/dL (ref 70–99)
Potassium: 4.6 mEq/L (ref 3.5–5.1)
SODIUM: 137 meq/L (ref 135–145)

## 2014-03-26 LAB — LIPID PANEL
CHOLESTEROL: 175 mg/dL (ref 0–200)
HDL: 68.4 mg/dL (ref >39.00)
LDL Cholesterol: 94 mg/dL (ref 0–99)
NonHDL: 106.6
Total CHOL/HDL Ratio: 3
Triglycerides: 61 mg/dL (ref 0.0–149.0)
VLDL: 12.2 mg/dL (ref 0.0–40.0)

## 2014-03-26 NOTE — Progress Notes (Signed)
Pre visit review using our clinic review tool, if applicable. No additional management support is needed unless otherwise documented below in the visit note. 

## 2014-03-26 NOTE — Progress Notes (Signed)
   Subjective:    Patient ID: Bonnie Gutierrez, female    DOB: 07-21-1990, 23 y.o.   MRN: [id number]  HPI 23 yr old female for a cpx. She feels well. 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    Review of Systems  Constitutional: Negative.   HENT: Negative.   Eyes: Negative.   Respiratory: Negative.   Cardiovascular: Negative.   Gastrointestinal: Negative.   Genitourinary: Negative for dysuria, urgency, frequency, hematuria, flank pain, decreased urine volume, enuresis, difficulty urinating, pelvic pain and dyspareunia.  Musculoskeletal: Negative.   Skin: Negative.   Neurological: Negative.   Psychiatric/Behavioral: Negative.        Objective:   Physical Exam  Constitutional: She is oriented to person, place, and time. She appears well-developed and well-nourished. No distress.  HENT:  Head: Normocephalic and atraumatic.  Right Ear: External ear normal.  Left Ear: External ear normal.  Nose: Nose normal.  Mouth/Throat: Oropharynx is clear and moist. No oropharyngeal exudate.  Eyes: Conjunctivae and EOM are normal. Pupils are equal, round, and reactive to light. No scleral icterus.  Neck: Normal range of motion. Neck supple. No JVD present. No thyromegaly present.  Cardiovascular: Normal rate, regular rhythm, normal heart sounds and intact distal pulses.  Exam reveals no gallop and no friction rub.   No murmur heard. Pulmonary/Chest: Effort normal and breath sounds normal. No respiratory distress. She has no wheezes. She has no rales. She exhibits no tenderness.  Abdominal: Soft. Bowel sounds are normal. She exhibits no distension and no mass. There is no tenderness. There is no rebound and no guarding.  Musculoskeletal: Normal range of motion. She exhibits no edema or tenderness.  Lymphadenopathy:    She has no cervical adenopathy.  Neurological: She is alert and oriented to person, place, and time. She has normal reflexes. No cranial nerve deficit. She  exhibits normal muscle tone. Coordination normal.  Skin: Skin is warm and dry. No rash noted. No erythema.  Psychiatric: She has a normal mood and affect. Her behavior is normal. Judgment and thought content normal.          Assessment & Plan:  Well exam.

## 2014-07-14 ENCOUNTER — Telehealth: Payer: Self-pay | Admitting: *Deleted

## 2014-07-14 ENCOUNTER — Ambulatory Visit (INDEPENDENT_AMBULATORY_CARE_PROVIDER_SITE_OTHER): Payer: BC Managed Care – PPO | Admitting: Family Medicine

## 2014-07-14 ENCOUNTER — Encounter: Payer: Self-pay | Admitting: Family Medicine

## 2014-07-14 VITALS — BP 100/60 | Temp 98.2°F | Wt 135.6 lb

## 2014-07-14 DIAGNOSIS — J0100 Acute maxillary sinusitis, unspecified: Secondary | ICD-10-CM

## 2014-07-14 MED ORDER — AZITHROMYCIN 250 MG PO TABS: ORAL_TABLET | ORAL | Status: DC

## 2014-07-14 NOTE — Telephone Encounter (Signed)
Pt called stating her eye has been "goopy" and Dr Clent RidgesFry told her that it may be from the sinus infection, pt stated she woke up from a nap and her eye was stuck shut with green goopy stuff. Pt is concerned if there is anything else she needs to be doing to keep it clean. Please advise

## 2014-07-14 NOTE — Progress Notes (Signed)
Pre visit review using our clinic review tool, if applicable. No additional management support is needed unless otherwise documented below in the visit note. 

## 2014-07-14 NOTE — Progress Notes (Signed)
   Subjective:    Patient ID: Bonnie Gutierrez, female    DOB: 1990/12/21, 24 y.o.   MRN: [id number]  HPI Here for one week of sinus pressure, PND, red eyes, ST, and a dry cough.    Review of Systems  Constitutional: Negative.   HENT: Positive for congestion, postnasal drip and sinus pressure.   Eyes: Positive for redness. Negative for discharge and visual disturbance.  Respiratory: Positive for cough.        Objective:   Physical Exam  Constitutional: She appears well-developed and well-nourished.  HENT:  Right Ear: External ear normal.  Left Ear: External ear normal.  Nose: Nose normal.  Mouth/Throat: Oropharynx is clear and moist.  Eyes:  Left conjunctiva is pink, right is clear   Pulmonary/Chest: Effort normal and breath sounds normal.  Lymphadenopathy:    She has no cervical adenopathy.          Assessment & Plan:  Add Mucinex D

## 2014-07-14 NOTE — Telephone Encounter (Signed)
Seen in office.

## 2014-10-10 ENCOUNTER — Encounter: Payer: Self-pay | Admitting: Family Medicine

## 2014-10-10 ENCOUNTER — Ambulatory Visit (INDEPENDENT_AMBULATORY_CARE_PROVIDER_SITE_OTHER): Payer: BC Managed Care – PPO | Admitting: Family Medicine

## 2014-10-10 VITALS — BP 98/66 | HR 73 | Temp 98.4°F | Ht 63.0 in | Wt 131.0 lb

## 2014-10-10 DIAGNOSIS — H109 Unspecified conjunctivitis: Secondary | ICD-10-CM

## 2014-10-10 MED ORDER — TOBRAMYCIN-DEXAMETHASONE 0.3-0.1 % OP SUSP: 2.0000 [drp] | OPHTHALMIC | Status: DC

## 2014-10-10 NOTE — Progress Notes (Signed)
Pre visit review using our clinic review tool, if applicable. No additional management support is needed unless otherwise documented below in the visit note. 

## 2014-10-10 NOTE — Progress Notes (Signed)
   Subjective:    Patient ID: Bonnie Gutierrez, female    DOB: 02-May-1991, 24 y.o.   MRN: [id number]  HPI Here for 2 days of pink eye on the right side with itching and a sticky DC. No pain. She is not wearing contacts this week. She has used her son's Polymyxin drops with no improvement.    Review of Systems  Constitutional: Negative.   HENT: Negative for congestion, ear pain, facial swelling, postnasal drip and sinus pressure.   Eyes: Positive for discharge, redness and itching. Negative for photophobia and pain.  Respiratory: Negative.        Objective:   Physical Exam  Constitutional: She appears well-developed and well-nourished.  HENT:  Right Ear: External ear normal.  Nose: Nose normal.  Mouth/Throat: Oropharynx is clear and moist.  Eyes:  Left eye is clear. Right conjunctiva is red with slight edema of both lids  Neck: Neck supple.  Lymphadenopathy:    She has no cervical adenopathy.          Assessment & Plan:  Pinkeye. Treat with Tobradex drops and warm compresses.

## 2015-07-12 ENCOUNTER — Emergency Department (HOSPITAL_COMMUNITY): Payer: BC Managed Care – PPO

## 2015-07-12 ENCOUNTER — Emergency Department (HOSPITAL_COMMUNITY)
Admission: EM | Admit: 2015-07-12 | Discharge: 2015-07-12 | Disposition: A | Discharge status: Home / Self Care | Payer: BC Managed Care – PPO | Attending: Emergency Medicine | Admitting: Emergency Medicine

## 2015-07-12 ENCOUNTER — Encounter (HOSPITAL_COMMUNITY): Payer: Self-pay

## 2015-07-12 DIAGNOSIS — Y9389 Activity, other specified: Secondary | ICD-10-CM | POA: Insufficient documentation

## 2015-07-12 DIAGNOSIS — Z872 Personal history of diseases of the skin and subcutaneous tissue: Secondary | ICD-10-CM | POA: Insufficient documentation

## 2015-07-12 DIAGNOSIS — S99911A Unspecified injury of right ankle, initial encounter: Secondary | ICD-10-CM | POA: Diagnosis present

## 2015-07-12 DIAGNOSIS — Z8659 Personal history of other mental and behavioral disorders: Secondary | ICD-10-CM | POA: Diagnosis not present

## 2015-07-12 DIAGNOSIS — S93401A Sprain of unspecified ligament of right ankle, initial encounter: Secondary | ICD-10-CM | POA: Insufficient documentation

## 2015-07-12 DIAGNOSIS — Y9289 Other specified places as the place of occurrence of the external cause: Secondary | ICD-10-CM | POA: Diagnosis not present

## 2015-07-12 DIAGNOSIS — Y998 Other external cause status: Secondary | ICD-10-CM | POA: Insufficient documentation

## 2015-07-12 DIAGNOSIS — W19XXXA Unspecified fall, initial encounter: Secondary | ICD-10-CM

## 2015-07-12 DIAGNOSIS — Z8719 Personal history of other diseases of the digestive system: Secondary | ICD-10-CM | POA: Insufficient documentation

## 2015-07-12 DIAGNOSIS — Z8742 Personal history of other diseases of the female genital tract: Secondary | ICD-10-CM | POA: Diagnosis not present

## 2015-07-12 DIAGNOSIS — Z8619 Personal history of other infectious and parasitic diseases: Secondary | ICD-10-CM | POA: Insufficient documentation

## 2015-07-12 DIAGNOSIS — S9001XA Contusion of right ankle, initial encounter: Secondary | ICD-10-CM | POA: Diagnosis not present

## 2015-07-12 DIAGNOSIS — S8391XA Sprain of unspecified site of right knee, initial encounter: Secondary | ICD-10-CM | POA: Diagnosis not present

## 2015-07-12 DIAGNOSIS — W108XXA Fall (on) (from) other stairs and steps, initial encounter: Secondary | ICD-10-CM | POA: Diagnosis not present

## 2015-07-12 DIAGNOSIS — Z79899 Other long term (current) drug therapy: Secondary | ICD-10-CM | POA: Diagnosis not present

## 2015-07-12 NOTE — Discharge Instructions (Signed)
Rest. Ice for 20 minutes every 2 hours while awake for the next 2 days.  Tylenol 1000 g every 6 hours as needed for pain.  Follow-up with your primary Dr. if not improving in the next week.   Ankle Sprain An ankle sprain is an injury to the strong, fibrous tissues (ligaments) that hold the bones of your ankle joint together.  CAUSES An ankle sprain is usually caused by a fall or by twisting your ankle. Ankle sprains most commonly occur when you step on the outer edge of your foot, and your ankle turns inward. People who participate in sports are more prone to these types of injuries.  SYMPTOMS   Pain in your ankle. The pain may be present at rest or only when you are trying to stand or walk.  Swelling.  Bruising. Bruising may develop immediately or within 1 to 2 days after your injury.  Difficulty standing or walking, particularly when turning corners or changing directions. DIAGNOSIS  Your caregiver will ask you details about your injury and perform a physical exam of your ankle to determine if you have an ankle sprain. During the physical exam, your caregiver will press on and apply pressure to specific areas of your foot and ankle. Your caregiver will try to move your ankle in certain ways. An X-ray exam may be done to be sure a bone was not broken or a ligament did not separate from one of the bones in your ankle (avulsion fracture).  TREATMENT  Certain types of braces can help stabilize your ankle. Your caregiver can make a recommendation for this. Your caregiver may recommend the use of medicine for pain. If your sprain is severe, your caregiver may refer you to a surgeon who helps to restore function to parts of your skeletal system (orthopedist) or a physical therapist. HOME CARE INSTRUCTIONS   Apply ice to your injury for 1-2 days or as directed by your caregiver. Applying ice helps to reduce inflammation and pain.  Put ice in a plastic bag.  Place a towel between your skin and  the bag.  Leave the ice on for 15-20 minutes at a time, every 2 hours while you are awake.  Only take over-the-counter or prescription medicines for pain, discomfort, or fever as directed by your caregiver.  Elevate your injured ankle above the level of your heart as much as possible for 2-3 days.  If your caregiver recommends crutches, use them as instructed. Gradually put weight on the affected ankle. Continue to use crutches or a cane until you can walk without feeling pain in your ankle.  If you have a plaster splint, wear the splint as directed by your caregiver. Do not rest it on anything harder than a pillow for the first 24 hours. Do not put weight on it. Do not get it wet. You may take it off to take a shower or bath.  You may have been given an elastic bandage to wear around your ankle to provide support. If the elastic bandage is too tight (you have numbness or tingling in your foot or your foot becomes cold and blue), adjust the bandage to make it comfortable.  If you have an air splint, you may blow more air into it or let air out to make it more comfortable. You may take your splint off at night and before taking a shower or bath. Wiggle your toes in the splint several times per day to decrease swelling. SEEK MEDICAL CARE IF:  You have rapidly increasing bruising or swelling.  Your toes feel extremely cold or you lose feeling in your foot.  Your pain is not relieved with medicine. SEEK IMMEDIATE MEDICAL CARE IF:  Your toes are numb or blue.  You have severe pain that is increasing. MAKE SURE YOU:   Understand these instructions.  Will watch your condition.  Will get help right away if you are not doing well or get worse.   This information is not intended to replace advice given to you by your health care provider. Make sure you discuss any questions you have with your health care provider.   Document Released: 05/02/2005 Document Revised: 05/23/2014 Document  Reviewed: 05/14/2011 Elsevier Interactive Patient Education [employer]

## 2015-07-12 NOTE — ED Notes (Addendum)
Pt fell down stairs this am.  Pain in rt hip, knee and ankle.  No head injury or LOC.  PT IS PREGNANT

## 2015-07-12 NOTE — ED Provider Notes (Signed)
CSN: [id number]     Arrival date & time 07/12/15  4098 History   First MD Initiated Contact with Patient 07/12/15 [phone]     Chief Complaint  Patient presents with  . Hip Pain  . Ankle Pain     (Consider location/radiation/quality/duration/timing/severity/associated sxs/prior Treatment) HPI Comments: Patient is a 25 year old female with no significant past medical history. She presents with complaints of pain in her knee and ankle resulting from a fall. She states she was walking down the stairs this morning carrying her 58-year-old child when she missed a step, lost her balance, and slid to the floor. She is complaining of pain in her right knee and ankle with ambulation.  The history is provided by the patient.    Past Medical History  Diagnosis Date  . Depression   . GERD (gastroesophageal reflux disease)   . Acne vulgaris   . Dysmenorrhea   . Torn ACL (anterior cruciate ligament)     left foot Dr Lestine Box  2008  . Chicken pox   . Routine gynecological examination     sees Dr. Jethro Poling in [facility]    History reviewed. No pertinent past surgical history. Family History  Problem Relation Age of Onset  . Allergies      family hx  . Depression      family hx   Social History  Substance Use Topics  . Smoking status: Never Smoker   . Smokeless tobacco: Never Used  . Alcohol Use: 0.0 oz/week    0 Standard drinks or equivalent per week     Comment: rare   OB History    Gravida Para Term Preterm AB TAB SAB Ectopic Multiple Living   2              Review of Systems  All other systems reviewed and are negative.     Allergies  Sulfonamide derivatives  Home Medications   Prior to Admission medications   Medication Sig Start Date End Date Taking? Authorizing Provider  Doxylamine-Pyridoxine (DICLEGIS) 10-10 MG TBEC Take 1-2 tablets by mouth 2 (two) times daily. 1 tablet in the morning and 2 tablets at night   Yes Historical Provider, MD  Prenatal Vit-Fe Fumarate-FA  (PRENATAL MULTIVITAMIN) TABS tablet Take 1 tablet by mouth daily at 12 noon.   Yes Historical Provider, MD  albuterol (PROVENTIL HFA;VENTOLIN HFA) 108 (90 BASE) MCG/ACT inhaler Inhale 2 puffs into the lungs every 6 (six) hours as needed for wheezing. Patient not taking: Reported on 10/10/2014 05/07/12   Terressa Koyanagi, DO  tobramycin-dexamethasone [facility]) ophthalmic solution Place 2 drops into the right eye every 4 (four) hours while awake. 10/10/14   Nelwyn Salisbury, MD   BP 96/55 mmHg  Pulse 77  Temp(Src) 98.8 F (37.1 C) (Oral)  Resp 17  SpO2 100% Physical Exam  Constitutional: She is oriented to person, place, and time. She appears well-developed and well-nourished. No distress.  HENT:  Head: Normocephalic and atraumatic.  Neck: Normal range of motion. Neck supple.  Musculoskeletal:  The right knee appears grossly normal. There is some tenderness over the MCL, however there is no laxity with varus or valgus stress. She has good range of motion without crepitus and anterior and posterior drawer test is negative.  Her right ankle appears grossly normal. There is a slight contusion just above the medial malleolus, however no deformity otherwise. She has good range of motion and the ankle joint appears stable.  Distal PMS is intact to the  entire foot.  Neurological: She is alert and oriented to person, place, and time.  Skin: Skin is warm and dry. She is not diaphoretic.  Nursing note and vitals reviewed.   ED Course  Procedures (including critical care time) Labs Review Labs Reviewed - No data to display  Imaging Review No results found. I have personally reviewed and evaluated these images and lab results as part of my medical decision-making.    MDM   Final diagnoses:  Fall    X-rays negative. Will advise ice, rest, Tylenol, and when necessary follow-up if not improving in the next week.    Geoffery Lyons, MD 07/12/15 [phone]

## 2015-07-12 NOTE — ED Notes (Signed)
Patient transported to X-ray 

## 2015-07-12 NOTE — ED Notes (Signed)
MD at bedside. 

## 2015-07-31 ENCOUNTER — Ambulatory Visit (INDEPENDENT_AMBULATORY_CARE_PROVIDER_SITE_OTHER): Payer: BC Managed Care – PPO | Admitting: Family Medicine

## 2015-07-31 DIAGNOSIS — Z23 Encounter for immunization: Secondary | ICD-10-CM | POA: Diagnosis not present

## 2015-08-19 ENCOUNTER — Ambulatory Visit (INDEPENDENT_AMBULATORY_CARE_PROVIDER_SITE_OTHER): Payer: BC Managed Care – PPO | Admitting: Family Medicine

## 2015-08-19 ENCOUNTER — Encounter: Payer: Self-pay | Admitting: Family Medicine

## 2015-08-19 VITALS — BP 94/68 | HR 106 | Temp 99.4°F | Ht 63.0 in | Wt 127.0 lb

## 2015-08-19 DIAGNOSIS — J209 Acute bronchitis, unspecified: Secondary | ICD-10-CM | POA: Diagnosis not present

## 2015-08-19 MED ORDER — CEPHALEXIN 500 MG PO CAPS: 500.0000 mg | ORAL_CAPSULE | Freq: Three times a day (TID) | ORAL | Status: AC

## 2015-08-19 NOTE — Progress Notes (Signed)
   Subjective:    Patient ID: Bonnie Gutierrez, female    DOB: April 10, 1991, 25 y.o.   MRN: [id number]  HPI Here for 5 days of stuffy head, PND, chest congestion and coughing up yellow sputum. Using Robitussin. She is [redacted] weeks pregnant.    Review of Systems  Constitutional: Positive for fever.  HENT: Positive for congestion, postnasal drip and sore throat. Negative for ear pain and sinus pressure.   Eyes: Negative.   Respiratory: Positive for cough and chest tightness. Negative for shortness of breath and wheezing.        Objective:   Physical Exam  Constitutional: She appears well-developed and well-nourished.  HENT:  Right Ear: External ear normal.  Left Ear: External ear normal.  Nose: Nose normal.  Mouth/Throat: Oropharynx is clear and moist.  Eyes: Conjunctivae are normal.  Neck: No thyromegaly present.  Pulmonary/Chest: Effort normal. No respiratory distress. She has no wheezes. She has no rales.  Soft rhonchi   Lymphadenopathy:    She has no cervical adenopathy.          Assessment & Plan:  Bronchitis, given Keflex.  Nelwyn SalisburyFRY,Nakyia Dau A, MD

## 2015-08-19 NOTE — Progress Notes (Signed)
Pre visit review using our clinic review tool, if applicable. No additional management support is needed unless otherwise documented below in the visit note. 

## 2015-09-07 ENCOUNTER — Ambulatory Visit (INDEPENDENT_AMBULATORY_CARE_PROVIDER_SITE_OTHER): Payer: BC Managed Care – PPO | Admitting: Family Medicine

## 2015-09-07 ENCOUNTER — Encounter: Payer: Self-pay | Admitting: Family Medicine

## 2015-09-07 VITALS — BP 96/56 | HR 83 | Temp 98.5°F | Ht 63.0 in | Wt 132.0 lb

## 2015-09-07 DIAGNOSIS — J019 Acute sinusitis, unspecified: Secondary | ICD-10-CM | POA: Diagnosis not present

## 2015-09-07 MED ORDER — AMOXICILLIN-POT CLAVULANATE 875-125 MG PO TABS: 1.0000 | ORAL_TABLET | Freq: Two times a day (BID) | ORAL | Status: DC

## 2015-09-07 NOTE — Progress Notes (Signed)
Pre visit review using our clinic review tool, if applicable. No additional management support is needed unless otherwise documented below in the visit note. 

## 2015-09-07 NOTE — Progress Notes (Signed)
   Subjective:    Patient ID: Bonnie Gutierrez, female    DOB: 02-16-1991, 25 y.o.   MRN: [id number]  HPI Here for 5 days of sinus pressure, PND, ST, and a dry cough. No fever. On Mucinex. She was here recently for a bronchitis and this cleared nicely after taking Keflex.    Review of Systems  Constitutional: Negative.   HENT: Positive for congestion, postnasal drip and sinus pressure. Negative for sore throat.   Eyes: Negative.   Respiratory: Negative.        Objective:   Physical Exam  Constitutional: She appears well-developed and well-nourished.  HENT:  Right Ear: External ear normal.  Left Ear: External ear normal.  Nose: Nose normal.  Mouth/Throat: Oropharynx is clear and moist.  Eyes: Conjunctivae are normal.  Neck: No thyromegaly present.  Pulmonary/Chest: Effort normal and breath sounds normal.  Lymphadenopathy:    She has no cervical adenopathy.         Assessment & Plan:  Sinusitis, treat with Augmentin.  Nelwyn SalisburyFRY,[name], MD

## 2016-08-18 ENCOUNTER — Ambulatory Visit (INDEPENDENT_AMBULATORY_CARE_PROVIDER_SITE_OTHER): Payer: BC Managed Care – PPO | Admitting: Family Medicine

## 2016-08-18 ENCOUNTER — Encounter: Payer: Self-pay | Admitting: Family Medicine

## 2016-08-18 VITALS — BP 98/64 | HR 82 | Temp 98.7°F | Wt 129.4 lb

## 2016-08-18 DIAGNOSIS — J0140 Acute pansinusitis, unspecified: Secondary | ICD-10-CM | POA: Diagnosis not present

## 2016-08-18 MED ORDER — AMOXICILLIN-POT CLAVULANATE 875-125 MG PO TABS: 1.0000 | ORAL_TABLET | Freq: Two times a day (BID) | ORAL | 0 refills | Status: DC

## 2016-08-18 NOTE — Progress Notes (Signed)
Pre visit review using our clinic review tool, if applicable. No additional management support is needed unless otherwise documented below in the visit note. 

## 2016-08-18 NOTE — Patient Instructions (Addendum)
It was a pleasure to see you today.  Please take medication as directed and follow up if symptoms do not improve in 3 to 4 days, worsen, or you develop a fever >101.   Sinusitis, Adult Sinusitis is soreness and inflammation of your sinuses. Sinuses are hollow spaces in the bones around your face. They are located:  Around your eyes.  In the middle of your forehead.  Behind your nose.  In your cheekbones. Your sinuses and nasal passages are lined with a stringy fluid (mucus). Mucus normally drains out of your sinuses. When your nasal tissues get inflamed or swollen, the mucus can get trapped or blocked so air cannot flow through your sinuses. This lets bacteria, viruses, and funguses grow, and that leads to infection. Follow these instructions at home: Medicines   Take, use, or apply over-the-counter and prescription medicines only as told by your doctor. These may include nasal sprays.  If you were prescribed an antibiotic medicine, take it as told by your doctor. Do not stop taking the antibiotic even if you start to feel better. Hydrate and Humidify   Drink enough water to keep your pee (urine) clear or pale yellow.  Use a cool mist humidifier to keep the humidity level in your home above 50%.  Breathe in steam for 10-15 minutes, 3-4 times a day or as told by your doctor. You can do this in the bathroom while a hot shower is running.  Try not to spend time in cool or dry air. Rest   Rest as much as possible.  Sleep with your head raised (elevated).  Make sure to get enough sleep each night. General instructions   Put a warm, moist washcloth on your face 3-4 times a day or as told by your doctor. This will help with discomfort.  Wash your hands often with soap and water. If there is no soap and water, use hand sanitizer.  Do not smoke. Avoid being around people who are smoking (secondhand smoke).  Keep all follow-up visits as told by your doctor. This is  important. Contact a doctor if:  You have a fever.  Your symptoms get worse.  Your symptoms do not get better within 10 days. Get help right away if:  You have a very bad headache.  You cannot stop throwing up (vomiting).  You have pain or swelling around your face or eyes.  You have trouble seeing.  You feel confused.  Your neck is stiff.  You have trouble breathing. This information is not intended to replace advice given to you by your health care provider. Make sure you discuss any questions you have with your health care provider. Document Released: 10/19/2007 Document Revised: 12/27/2015 Document Reviewed: 02/25/2015 Elsevier Interactive Patient Education  2017 Elsevier Inc.   WE NOW OFFER   [facility] FAST TRACK!!!  SAME DAY Appointments for ACUTE CARE  Such as: Sprains, Injuries, cuts, abrasions, rashes, muscle pain, joint pain, back pain Colds, flu, sore throats, headache, allergies, cough, fever  Ear pain, sinus and eye infections Abdominal pain, nausea, vomiting, diarrhea, upset stomach Animal/insect bites  3 Easy Ways to Schedule: Walk-In Scheduling Call in scheduling Mychart Sign-up: [email or web address]

## 2016-08-18 NOTE — Progress Notes (Signed)
Patient ID: Bonnie Gutierrez, female   DOB: 1990-12-06, 26 y.o.   MRN: [id number]  PCP: Gershon Crane, MD  Subjective:  Bonnie Gutierrez is a 26 y.o. year old very pleasant female patient who presents with Upper Respiratory infection symptoms including nasal congestion, post nasal drip, cough productive with green sputum -started: 6 days ago, symptoms are worsening. -previous treatments: Zyrtec which has provided limited treatment -sick contacts/travel/risks: denies flu exposure, no recent sick contacts -Hx of: allergies  ROS-denies fever, SOB, NVD, tooth pain, sore throat  Pertinent Past Medical History- Currently lactating  Medications- reviewed  Current Outpatient Prescriptions  Medication Sig Dispense Refill  . Prenatal Vit-Fe Fumarate-FA (PRENATAL MULTIVITAMIN) TABS tablet Take 1 tablet by mouth daily at 12 noon.     No current facility-administered medications for this visit.     Objective: BP 98/64 (BP Location: Left Arm, Patient Position: Sitting, Cuff Size: Normal)   Pulse 82   Temp 98.7 F (37.1 C) (Oral)   Wt 129 lb 6.4 oz (58.7 kg)   SpO2 99%   BMI 22.92 kg/m  Gen: NAD, resting comfortably HEENT: Turbinates erythematous, TM normal, pharynx mildly erythematous with no tonsilar exudate or edema, + sinus tenderness maxillary, frontal CV: RRR no murmurs rubs or gallops Lungs: CTAB no crackles, wheeze, rhonchi Abdomen: soft/nontender/nondistended/normal bowel sounds. No rebound or guarding.  Ext: no edema Skin: warm, dry, no rash Neuro: grossly normal, moves all extremities  Assessment/Plan: 1. 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 Cross checked lactating and Augmentin on [facility]ocrates. Advised patient to notify clinic if she notices any  adverse effects including loose stools from her 62 month old infant when taking this medication. While this is not likely, advised her to monitor  - amoxicillin-clavulanate (AUGMENTIN) 875-125 MG tablet; Take 1 tablet by mouth 2 (two) times daily.  Dispense: 20 tablet; Refill: 0  Finally, we reviewed reasons to return to care including if symptoms worsen or persist or new concerns arise- once again particularly shortness of breath or fever.    Inez Catalina, FNP

## 2016-09-25 IMAGING — CR DG TIBIA/FIBULA 2V*R*
2 series · 2 of 2 positions shown · non-contrast
Comparison: None.

CLINICAL DATA: Right knee and ankle pain. Fell down steps earlier
today.

EXAM:
RIGHT TIBIA AND FIBULA - 2 VIEW

[x tib-fib ap right]
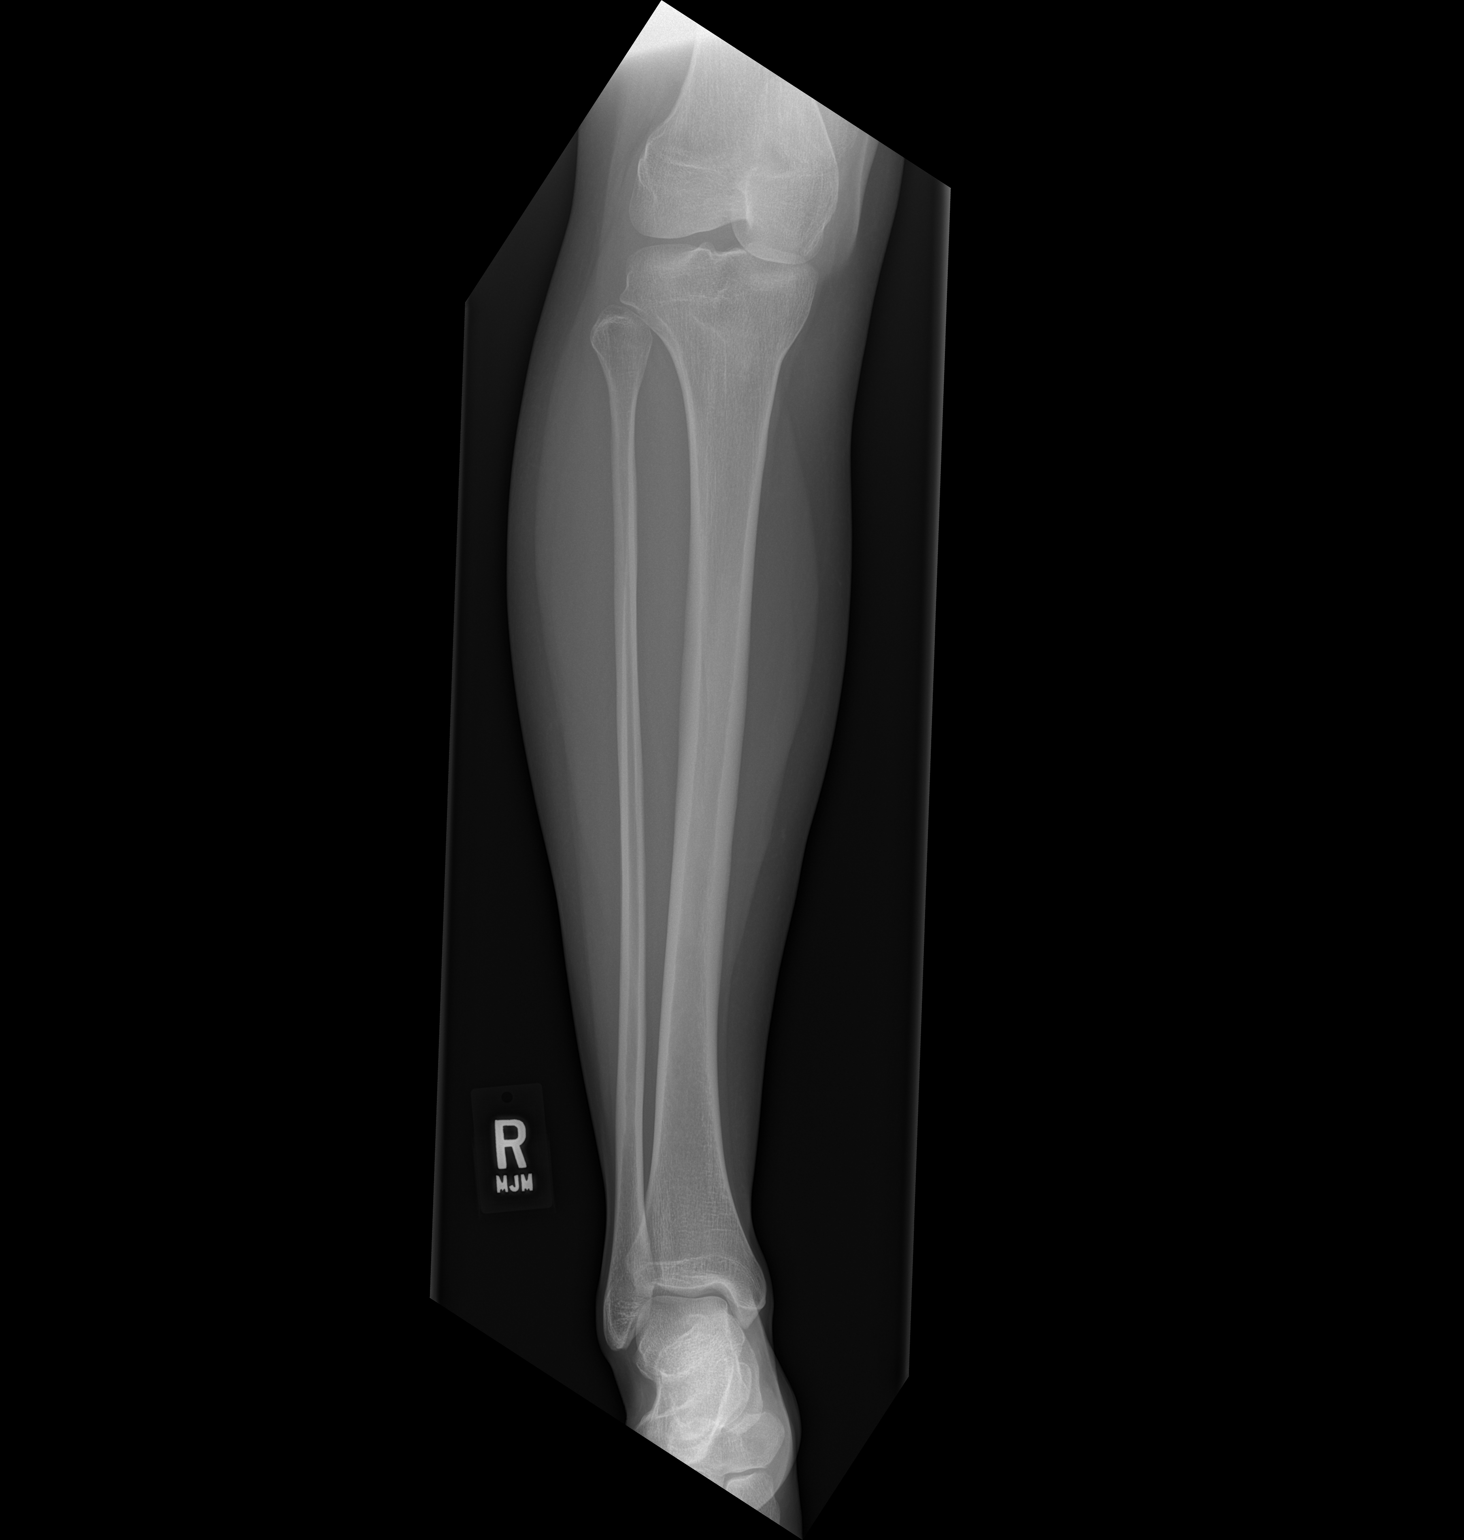

[x tib-fib lat right]
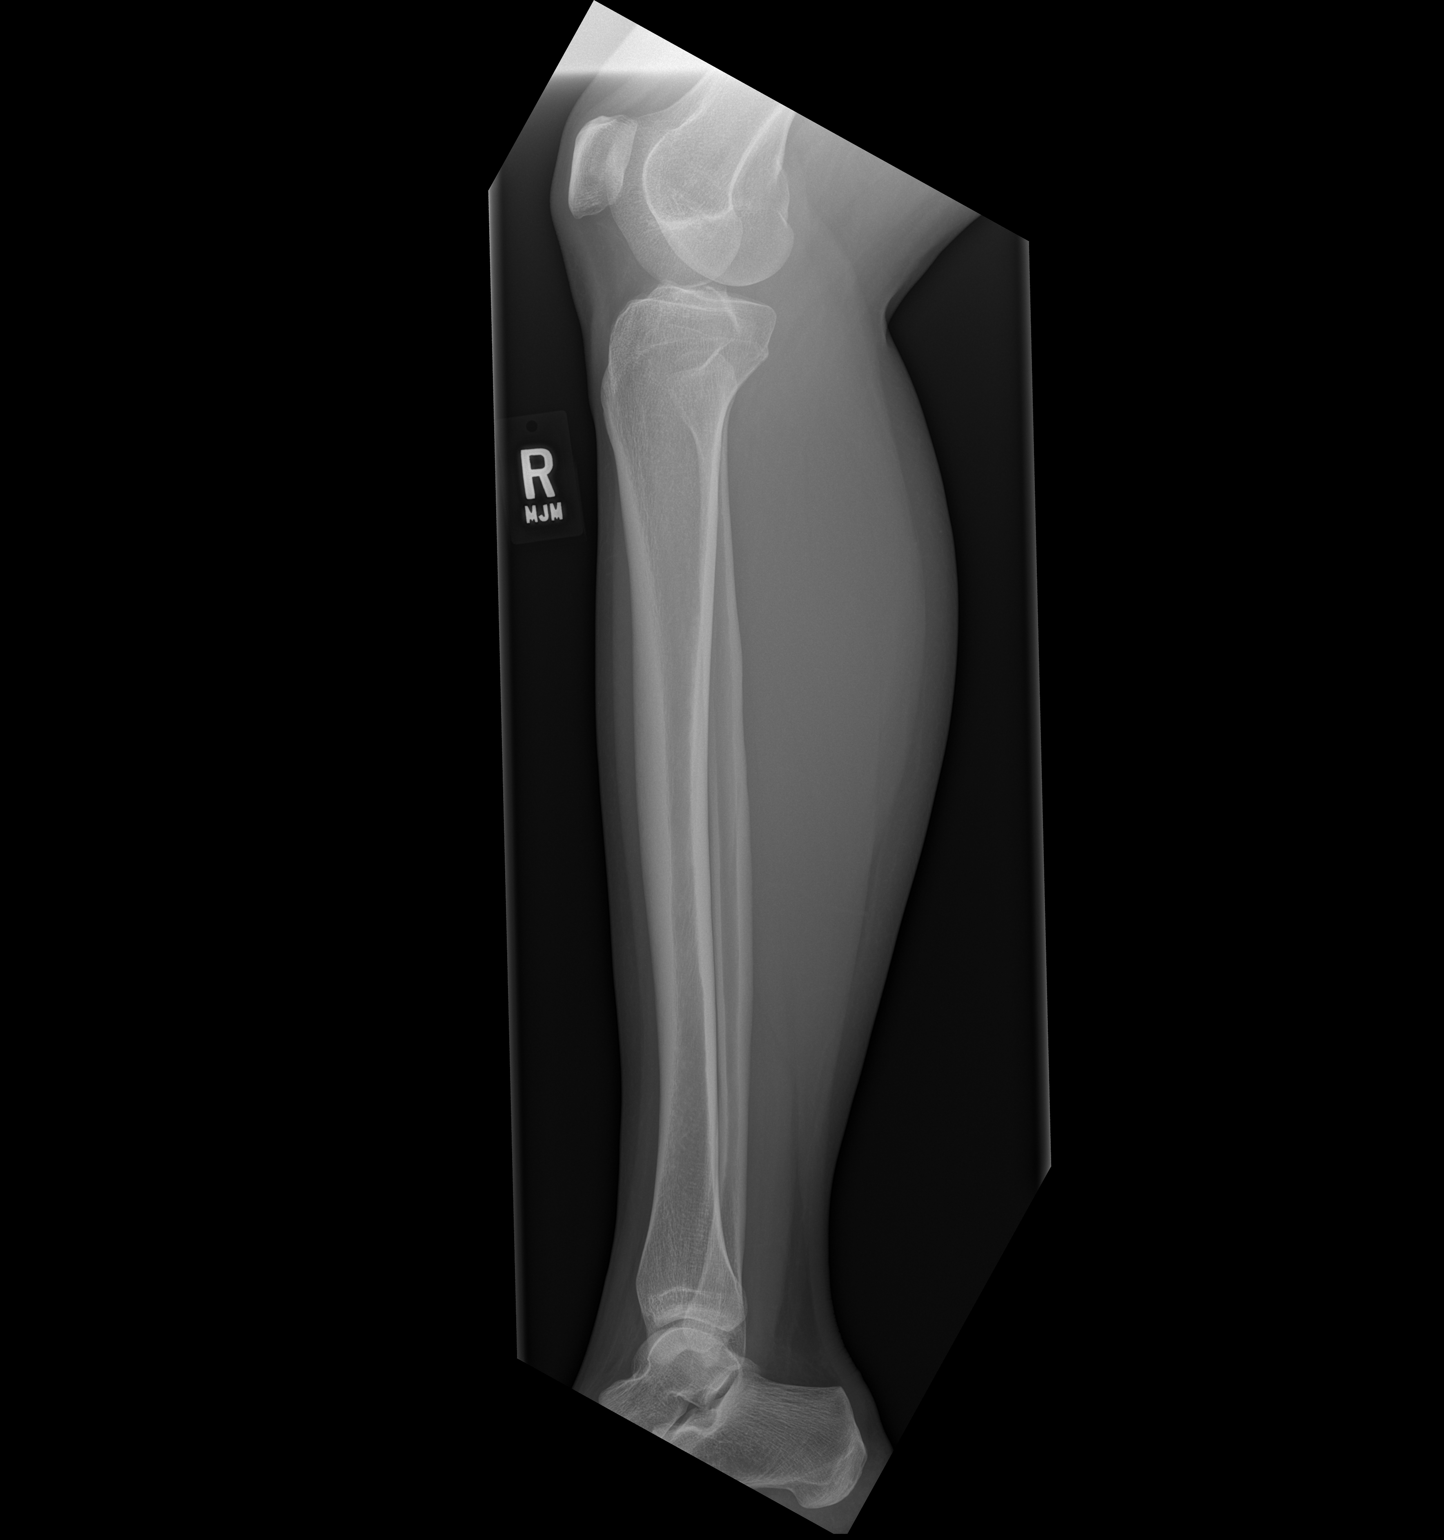

[2 of 2 positions shown; findings below may reference images not displayed]

FINDINGS: There is no evidence of fracture or other focal bone lesions. Soft
tissues are unremarkable.
IMPRESSION: Negative.

## 2016-12-15 ENCOUNTER — Encounter: Payer: Self-pay | Admitting: Family Medicine

## 2016-12-15 ENCOUNTER — Ambulatory Visit (INDEPENDENT_AMBULATORY_CARE_PROVIDER_SITE_OTHER): Payer: BC Managed Care – PPO | Admitting: Family Medicine

## 2016-12-15 VITALS — BP 96/65 | HR 62 | Temp 98.5°F

## 2016-12-15 DIAGNOSIS — L237 Allergic contact dermatitis due to plants, except food: Secondary | ICD-10-CM | POA: Diagnosis not present

## 2016-12-15 MED ORDER — BETAMETHASONE DIPROPIONATE 0.05 % EX CREA: TOPICAL_CREAM | Freq: Two times a day (BID) | CUTANEOUS | 0 refills | Status: DC

## 2016-12-15 NOTE — Patient Instructions (Signed)
WE NOW OFFER   [facility] FAST TRACK!!!  SAME DAY Appointments for ACUTE CARE  Such as: Sprains, Injuries, cuts, abrasions, rashes, muscle pain, joint pain, back pain Colds, flu, sore throats, headache, allergies, cough, fever  Ear pain, sinus and eye infections Abdominal pain, nausea, vomiting, diarrhea, upset stomach Animal/insect bites  3 Easy Ways to Schedule: Walk-In Scheduling Call in scheduling Mychart Sign-up: [email or web address]         

## 2016-12-15 NOTE — Progress Notes (Signed)
   Subjective:    Patient ID: Bonnie Gutierrez, female    DOB: [birth year], 26 y.o.   MRN: [id number]  HPI Here for 4 days of itchy rashes over the body. These began to appear after she cleared some brush in her yard last weekend.    Review of Systems  Constitutional: Negative.   Respiratory: Negative.   Cardiovascular: Negative.   Skin: Positive for rash.       Objective:   Physical Exam  Constitutional: She appears well-developed and well-nourished.  Cardiovascular: Normal rate, regular rhythm, normal heart sounds and intact distal pulses.   Pulmonary/Chest: Effort normal and breath sounds normal. No respiratory distress. She has no wheezes. She has no rales.  Skin:  Small scattered patches of red vesicles on the right shoulder, both forearms, and left knee           Assessment & Plan:  Contact dermatitis, treat with Betamethasone cream prn. Gershon CraneStephen Fry, MD

## 2017-03-16 ENCOUNTER — Encounter: Payer: Self-pay | Admitting: Internal Medicine

## 2017-03-16 ENCOUNTER — Ambulatory Visit (INDEPENDENT_AMBULATORY_CARE_PROVIDER_SITE_OTHER): Payer: BC Managed Care – PPO | Admitting: Internal Medicine

## 2017-03-16 VITALS — BP 98/62 | HR 80 | Temp 98.7°F | Wt 124.4 lb

## 2017-03-16 DIAGNOSIS — J018 Other acute sinusitis: Secondary | ICD-10-CM

## 2017-03-16 DIAGNOSIS — J40 Bronchitis, not specified as acute or chronic: Secondary | ICD-10-CM

## 2017-03-16 MED ORDER — PREDNISONE 20 MG PO TABS: ORAL_TABLET | ORAL | 0 refills | Status: DC

## 2017-03-16 MED ORDER — AMOXICILLIN 500 MG PO CAPS: 500.0000 mg | ORAL_CAPSULE | Freq: Three times a day (TID) | ORAL | 0 refills | Status: DC

## 2017-03-16 NOTE — Patient Instructions (Addendum)
I think this is  A viral resp infection like the croup.  Chest is clear on exam .   Sinus hygiene saline sprays  Decongestants warm  Compresses as needed   If sinus pain over weekend not  Improving can add antibiotic but   Guidelines advise   Antibiotics not helpful    In the first 10 days of illness .    Laryngitis Laryngitis is inflammation of your vocal cords. This causes hoarseness, coughing, loss of voice, sore throat, or a dry throat. Your vocal cords are two bands of muscles that are found in your throat. When you speak, these cords come together and vibrate. These vibrations come out through your mouth as sound. When your vocal cords are inflamed, your voice sounds different. Laryngitis can be temporary (acute) or long-term (chronic). Most cases of acute laryngitis improve with time. Chronic laryngitis is laryngitis that lasts for more than three weeks. What are the causes? Acute laryngitis may be caused by:  A viral infection.  Lots of talking, yelling, or singing. This is also called vocal strain.  Bacterial infections.  Chronic laryngitis may be caused by:  Vocal strain.  Injury to your vocal cords.  Acid reflux (gastroesophageal reflux disease or GERD).  Allergies.  Sinus infection.  Smoking.  Alcohol abuse.  Breathing in chemicals or dust.  Growths on the vocal cords.  What increases the risk? Risk factors for laryngitis include:  Smoking.  Alcohol abuse.  Having allergies.  What are the signs or symptoms? Symptoms of laryngitis may include:  Low, hoarse voice.  Loss of voice.  Dry cough.  Sore throat.  Stuffy nose.  How is this diagnosed? Laryngitis may be diagnosed by:  Physical exam.  Throat culture.  Blood test.  Laryngoscopy. This procedure allows your health care provider to look at your vocal cords with a mirror or viewing tube.  How is this treated? Treatment for laryngitis depends on what is causing it. Usually, treatment  involves resting your voice and using medicines to soothe your throat. However, if your laryngitis is caused by a bacterial infection, you may need to take antibiotic medicine. If your laryngitis is caused by a growth, you may need to have a procedure to remove it. Follow these instructions at home:  Drink enough fluid to keep your urine clear or pale yellow.  Breathe in moist air. Use a humidifier if you live in a dry climate.  Take medicines only as directed by your health care provider.  If you were prescribed an antibiotic medicine, finish it all even if you start to feel better.  Do not smoke cigarettes or electronic cigarettes. If you need help quitting, ask your health care provider.  Talk as little as possible. Also avoid whispering, which can cause vocal strain.  Write instead of talking. Do this until your voice is back to normal. Contact a health care provider if:  You have a fever.  You have increasing pain.  You have difficulty swallowing. Get help right away if:  You cough up blood.  You have trouble breathing. This information is not intended to replace advice given to you by your health care provider. Make sure you discuss any questions you have with your health care provider. Document Released: 05/02/2005 Document Revised: 10/08/2015 Document Reviewed: 10/15/2013 Elsevier Interactive Patient Education  [employer]2018 Elsevier Inc.

## 2017-03-16 NOTE — Progress Notes (Signed)
Chief Complaint  Patient presents with  . Sinus Problem    Pt c/o sinus congestion and hoarseness with weather change x 5 days. Pt states that she lost her voice 2 days ago. Pt feels that mucus is settling in her chest and having some issues getting a breath out. Green mucus. Denies fever.     HPI: Bonnie Gutierrez 26 y.o. sda PCP NA  Onset 5-6 days ago of upper respiratory congestion and drainage.  And then he came very hoarse in the last 48 hours.  Last night and this morning had some difficulty breathing out but no wheezing.  No fever or chills hoarseness comes and goes some facial sinus pressure did take NyQuil. Negative ETS history of asthma.  She teaches elementary school.  She has a 28-year-old child who a couple weeks ago had acute croup and was treated with dexamethasone is doing better.   ROS: See pertinent positives and negatives per HPI. No wheezing vomiting rash fever chills  Pt is not pregnant    But ehr says this  Past Medical History:  Diagnosis Date  . Acne vulgaris   . Chicken pox   . Depression   . Dysmenorrhea   . GERD (gastroesophageal reflux disease)   . Routine gynecological examination    [place]. J[place] [facility]urt [facility]   . Torn ACL (anterior cruciate ligament)    left foot Dr Lestine BoxBednarz  2008    Family History  Problem Relation Age of Onset  . Allergies Unknown        family hx  . Depression Unknown        family hx    Social History   Social History  . Marital status: Married    Spouse name: N/A  . Number of children: N/A  . Years of education: N/A   Social History Main Topics  . Smoking status: Never Smoker  . Smokeless tobacco: Never Used  . Alcohol use No  . Drug use: No  . Sexual activity: Not Asked   Other Topics Concern  . None   Social History Narrative  . None    Outpatient Medications Prior to Visit  Medication Sig Dispense Refill  . Prenatal Vit-Fe Fumarate-FA (PRENATAL MULTIVITAMIN) TABS tablet Take 1 tablet by  mouth daily at 12 noon.    . betamethasone dipropionate (DIPROLENE) 0.05 % cream Apply topically 2 (two) times daily. (Patient not taking: Reported on 03/16/2017) 45 g 0   No facility-administered medications prior to visit.      EXAM:  BP 98/62 (BP Location: Right Arm, Patient Position: Sitting, Cuff Size: Normal)   Pulse 80   Temp 98.7 F (37.1 C) (Oral)   Wt 124 lb 6.4 oz (56.4 kg)   BMI 22.04 kg/m   Body mass index is 22.04 kg/m.  GENERAL: vitals reviewed and listed above, alert, oriented, appears well hydrated and in no acute distress she is modestly hoarse but there is no stridor.  Nontoxic-appearing HEENT: atraumatic, conjunctiva  clear, no obvious abnormalities on inspection of external nose and ears TMs are clear no acute findings nares slight congestion face minimally tender OP : no lesion edema or exudate mild erythema NECK: no obvious masses on inspection palpation  LUNGS: clear to auscultation bilaterally, no wheezes, rales or rhonchi, good air movement there is no stridor CV: HRRR, no clubbing cyanosis or  peripheral edema nl cap refill  MS: moves all extremities without noticeable focal  abnormality PSYCH: pleasant and cooperative, no  obvious depression or anxiety  ASSESSMENT AND PLAN:  Discussed the following assessment and plan:  LTB (laryngotracheobronchitis) croup viral  - with sinus involvement   Other acute sinusitis, recurrence not specified Discussed etiology most likely a viral croup variant based on history and contacts.  However patient is concerned over the weekend if things get worse about treatment for possible sinusitis. Prescription printed expectant management to add but at this time would not treat also prescription for prednisone if needed but I doubt will be needed at this time. Seek medical care emergent if alarm features. -Patient advised to return or notify health care team  if symptoms worsen ,persist or new concerns arise.  Patient  Instructions  I think this is  A viral resp infection like the croup.  Chest is clear on exam .   Sinus hygiene saline sprays  Decongestants warm  Compresses as needed   If sinus pain over weekend not  Improving can add antibiotic but   Guidelines advise   Antibiotics not helpful    In the first 10 days of illness .    Laryngitis Laryngitis is inflammation of your vocal cords. This causes hoarseness, coughing, loss of voice, sore throat, or a dry throat. Your vocal cords are two bands of muscles that are found in your throat. When you speak, these cords come together and vibrate. These vibrations come out through your mouth as sound. When your vocal cords are inflamed, your voice sounds different. Laryngitis can be temporary (acute) or long-term (chronic). Most cases of acute laryngitis improve with time. Chronic laryngitis is laryngitis that lasts for more than three weeks. What are the causes? Acute laryngitis may be caused by:  A viral infection.  Lots of talking, yelling, or singing. This is also called vocal strain.  Bacterial infections.  Chronic laryngitis may be caused by:  Vocal strain.  Injury to your vocal cords.  Acid reflux (gastroesophageal reflux disease or GERD).  Allergies.  Sinus infection.  Smoking.  Alcohol abuse.  Breathing in chemicals or dust.  Growths on the vocal cords.  What increases the risk? Risk factors for laryngitis include:  Smoking.  Alcohol abuse.  Having allergies.  What are the signs or symptoms? Symptoms of laryngitis may include:  Low, hoarse voice.  Loss of voice.  Dry cough.  Sore throat.  Stuffy nose.  How is this diagnosed? Laryngitis may be diagnosed by:  Physical exam.  Throat culture.  Blood test.  Laryngoscopy. This procedure allows your health care provider to look at your vocal cords with a mirror or viewing tube.  How is this treated? Treatment for laryngitis depends on what is causing it.  Usually, treatment involves resting your voice and using medicines to soothe your throat. However, if your laryngitis is caused by a bacterial infection, you may need to take antibiotic medicine. If your laryngitis is caused by a growth, you may need to have a procedure to remove it. Follow these instructions at home:  Drink enough fluid to keep your urine clear or pale yellow.  Breathe in moist air. Use a humidifier if you live in a dry climate.  Take medicines only as directed by your health care provider.  If you were prescribed an antibiotic medicine, finish it all even if you start to feel better.  Do not smoke cigarettes or electronic cigarettes. If you need help quitting, ask your health care provider.  Talk as little as possible. Also avoid whispering, which can cause vocal strain.  Write instead of talking. Do this until your voice is back to normal. Contact a health care provider if:  You have a fever.  You have increasing pain.  You have difficulty swallowing. Get help right away if:  You cough up blood.  You have trouble breathing. This information is not intended to replace advice given to you by your health care provider. Make sure you discuss any questions you have with your health care provider. Document Released: 05/02/2005 Document Revised: 10/08/2015 Document Reviewed: 10/15/2013 Elsevier Interactive Patient Education  2018 [employer].       [place]. Darryle Dennie M.D.

## 2017-03-20 ENCOUNTER — Telehealth: Payer: Self-pay | Admitting: Family Medicine

## 2017-03-20 ENCOUNTER — Encounter: Payer: Self-pay | Admitting: Family Medicine

## 2017-03-20 ENCOUNTER — Ambulatory Visit: Payer: BC Managed Care – PPO | Admitting: Family Medicine

## 2017-03-20 VITALS — BP 100/60 | HR 82 | Temp 98.5°F | Wt 130.0 lb

## 2017-03-20 DIAGNOSIS — J069 Acute upper respiratory infection, unspecified: Secondary | ICD-10-CM | POA: Insufficient documentation

## 2017-03-20 MED ORDER — PREDNISONE 20 MG PO TABS: ORAL_TABLET | ORAL | 1 refills | Status: DC

## 2017-03-20 NOTE — Telephone Encounter (Signed)
Noted  

## 2017-03-20 NOTE — Patient Instructions (Signed)
Drink lots of liquids  Avoid salt  Prednisone 20 mg.........Marland Kitchen. 1 tab for 3 days, half a tab for 3 days, then a half a tab Monday Wednesday Friday for a two-week taper  Return when necessary

## 2017-03-20 NOTE — Telephone Encounter (Signed)
Patient Name: Bonnie DeemCHRISTINA Stiner DOB: 1991/04/14 Initial Comment Caller was seen on Thursday and diagnosed with croup and prescribed prednisone and amoxicillin. She is having difficulty with breathing today . States her throat is so swollen its difficult to breath and get air . States her back is sore ( straight up the spine & up to her neck ) She still feels very weak. Nurse Assessment Nurse: Bonnie Pandyamsey, RN, Bonnie Gutierrez Date/Time Bonnie Gutierrez(Eastern Time): 03/20/2017 9:24:38 AM Confirm and document reason for call. If symptomatic, describe symptoms. ---Caller states she was diagnosed with croup this past Thursday and prescribed Prednisone and Amoxicillin. Says her breathing was fine when she was on Prednisone but since stopping the Prednisone on Saturday her breathing has been more difficult. States the difficulty breathing comes and goes. Patient checked her pulse and it is currently 109. Does the patient have any new or worsening symptoms? ---Yes Will a triage be completed? ---Yes Related visit to physician within the last 2 weeks? ---Yes Does the PT have any chronic conditions? (i.e. diabetes, asthma, etc.) ---No Is the patient pregnant or possibly pregnant? (Ask all females between the ages of 3712-55) ---No Is this a behavioral health or substance abuse call? ---No Guidelines Guideline Title Affirmed Question Affirmed Notes Breathing Difficulty [1] MILD difficulty breathing (e.g., minimal/no SOB at rest, SOB with walking, pulse <100) AND [2] NEW-onset or WORSE than normal Final Disposition User See Physician within 4 Hours (or PCP triage) Bonnie Pandyamsey, RN, Bonnie Gutierrez Comments Appt scheduled for today at 1115 with Dr. Tawanna Coolerodd Referrals REFERRED TO PCP OFFICE Caller Disagree/Comply Comply Caller Understands Yes PreDisposition Go to Urgent Care/Walk-In Clinic

## 2017-03-20 NOTE — Progress Notes (Signed)
Bonnie Gutierrez is a 26 year old married female nonsmoker,,,,,,, school teacher by occupation,,,, who comes in today for evaluation of a cough and a cold  She was seen here last Thursday by Dr. Marvell FullerWP and was diagnosed to have laryngeal tracheal bronchitis. She was given amoxicillin for 5 days and prednisone 20 mg 3 times a day for 2 days. She comes in today because she doesn't feel any better.  She has no fever earache sore throat nausea vomiting diarrhea. She has noticed some weight gain and fluid retention since she took the prednisone. She states the prednisone helped with the cough but she'll he took it as noted above for 3 days.  BP 100/60 (BP Location: Left Arm, Patient Position: Sitting, Cuff Size: Normal)   Pulse 82   Temp 98.5 F (36.9 C) (Oral)   Wt 130 lb (59 kg)   LMP 03/13/2017 (Approximate)   SpO2 99%   BMI 23.03 kg/m  General she is a well-developed well-nourished female no acute distress examination HEENT was negative neck was supple thyroid is not enlarged no adenopathy lungs are clear  #1 viral infection,,,,,,,,,, treat symptomatically,,,,,,, explain these viral infections last 7-14 days and that antibiotics are not indicated for a viral infection. She did feel better on the prednisone so we will give her some prednisone but do a burst and taper.

## 2018-02-28 ENCOUNTER — Encounter: Payer: Self-pay | Admitting: Family Medicine

## 2018-02-28 ENCOUNTER — Ambulatory Visit: Payer: BC Managed Care – PPO | Admitting: Family Medicine

## 2018-02-28 VITALS — BP 102/68 | HR 83 | Temp 98.4°F | Wt 111.6 lb

## 2018-02-28 DIAGNOSIS — J209 Acute bronchitis, unspecified: Secondary | ICD-10-CM

## 2018-02-28 MED ORDER — AZITHROMYCIN 250 MG PO TABS
ORAL_TABLET | ORAL | 0 refills | Status: DC
Start: 2018-02-28 — End: 2018-03-07

## 2018-02-28 NOTE — Progress Notes (Signed)
   Subjective:    Patient ID: Bonnie Gutierrez, female    DOB: 1991-04-28, 27 y.o.   MRN: [id number]  HPI Here for 3 days of chest tightness and coughing up green sputum. No fever. Using Nyquoil and Dayquil.     Review of Systems  Constitutional: Negative.   HENT: Negative.   Eyes: Negative.   Respiratory: Positive for cough and chest tightness.        Objective:   Physical Exam  Constitutional: She appears well-developed and well-nourished.  HENT:  Right Ear: External ear normal.  Left Ear: External ear normal.  Nose: Nose normal.  Mouth/Throat: Oropharynx is clear and moist.  Eyes: Conjunctivae are normal.  Neck: No thyromegaly present.  Pulmonary/Chest: Effort normal. No stridor. No respiratory distress. She has no wheezes. She has no rales.  Scattered rhonchi   Lymphadenopathy:    She has no cervical adenopathy.          Assessment & Plan:  Bronchitis, treat with a Zpack. Written out of work today and tomorrow.  Gershon Crane, MD

## 2018-03-06 ENCOUNTER — Telehealth: Payer: Self-pay | Admitting: *Deleted

## 2018-03-06 NOTE — Telephone Encounter (Signed)
Copied from CRM [id number]. Topic: General - Other >> Mar 06, 2018  3:21 PM Gerrianne Scale wrote: Reason for CRM: pt calling stating that she was feeling better but when she had finished the zpac the symptoms came back and started feeling the same way she was a week in a half ago she want to know if she need something else or need to come back into the office

## 2018-03-07 MED ORDER — DOXYCYCLINE HYCLATE 100 MG PO TABS: 100.0000 mg | ORAL_TABLET | Freq: Two times a day (BID) | ORAL | 0 refills | Status: DC

## 2018-03-07 NOTE — Telephone Encounter (Signed)
Call in Doxycycline 100 mg bid for 10 days  

## 2018-03-07 NOTE — Telephone Encounter (Signed)
Rx sent and patient is aware. 

## 2018-03-30 ENCOUNTER — Ambulatory Visit: Payer: Self-pay | Admitting: Family Medicine

## 2018-07-04 ENCOUNTER — Encounter: Payer: Self-pay | Admitting: Family Medicine

## 2018-07-10 ENCOUNTER — Encounter: Payer: Self-pay | Admitting: Family Medicine

## 2018-07-10 ENCOUNTER — Ambulatory Visit (INDEPENDENT_AMBULATORY_CARE_PROVIDER_SITE_OTHER): Payer: BC Managed Care – PPO | Admitting: Family Medicine

## 2018-07-10 VITALS — BP 104/64 | HR 63 | Temp 98.0°F | Ht 63.0 in | Wt 108.2 lb

## 2018-07-10 DIAGNOSIS — Z23 Encounter for immunization: Secondary | ICD-10-CM

## 2018-07-10 DIAGNOSIS — Z0000 Encounter for general adult medical examination without abnormal findings: Secondary | ICD-10-CM

## 2018-07-10 LAB — POC URINALSYSI DIPSTICK (AUTOMATED)
Bilirubin, UA: NEGATIVE
Blood, UA: NEGATIVE
Glucose, UA: NEGATIVE
Leukocytes, UA: NEGATIVE
Nitrite, UA: NEGATIVE
PH UA: 6 (ref 5.0–8.0)
PROTEIN UA: NEGATIVE
SPEC GRAV UA: 1.025 (ref 1.010–1.025)
UROBILINOGEN UA: 0.2 U/dL

## 2018-07-10 LAB — BASIC METABOLIC PANEL
BUN: 11 mg/dL (ref 6–23)
CHLORIDE: 101 meq/L (ref 96–112)
CO2: 27 mEq/L (ref 19–32)
Calcium: 9.7 mg/dL (ref 8.4–10.5)
Creatinine, Ser: 0.72 mg/dL (ref 0.40–1.20)
GFR: 95.47 mL/min (ref >60.00)
Glucose, Bld: 73 mg/dL (ref 70–99)
POTASSIUM: 4.1 meq/L (ref 3.5–5.1)
SODIUM: 139 meq/L (ref 135–145)

## 2018-07-10 LAB — HEPATIC FUNCTION PANEL
ALK PHOS: 47 U/L (ref 39–117)
ALT: 14 U/L (ref 0–35)
AST: 18 U/L (ref 0–37)
Albumin: 4.5 g/dL (ref 3.5–5.2)
BILIRUBIN DIRECT: 0.1 mg/dL (ref 0.0–0.3)
TOTAL PROTEIN: 7 g/dL (ref 6.0–8.3)
Total Bilirubin: 0.6 mg/dL (ref 0.2–1.2)

## 2018-07-10 LAB — CBC WITH DIFFERENTIAL/PLATELET
BASOS PCT: 1.1 % (ref 0.0–3.0)
Basophils Absolute: 0.1 10*3/uL (ref 0.0–0.1)
Eosinophils Absolute: 0 10*3/uL (ref 0.0–0.7)
Eosinophils Relative: 0.5 % (ref 0.0–5.0)
HCT: 42.7 % (ref 36.0–46.0)
Hemoglobin: 14.4 g/dL (ref 12.0–15.0)
LYMPHS PCT: 37.8 % (ref 12.0–46.0)
Lymphs Abs: 2.2 10*3/uL (ref 0.7–4.0)
MCHC: 33.9 g/dL (ref 30.0–36.0)
MCV: 89.1 fl (ref 78.0–100.0)
Monocytes Absolute: 0.3 10*3/uL (ref 0.1–1.0)
Monocytes Relative: 5.1 % (ref 3.0–12.0)
Neutro Abs: 3.3 10*3/uL (ref 1.4–7.7)
Neutrophils Relative %: 55.5 % (ref 43.0–77.0)
Platelets: 212 10*3/uL (ref 150.0–400.0)
RBC: 4.79 Mil/uL (ref 3.87–5.11)
RDW: 12.3 % (ref 11.5–15.5)
WBC: 5.9 10*3/uL (ref 4.0–10.5)

## 2018-07-10 LAB — LIPID PANEL
CHOLESTEROL: 175 mg/dL (ref 0–200)
HDL: 102 mg/dL (ref >39.00)
LDL Cholesterol: 60 mg/dL (ref 0–99)
NONHDL: 73.09
Total CHOL/HDL Ratio: 2
Triglycerides: 63 mg/dL (ref 0.0–149.0)
VLDL: 12.6 mg/dL (ref 0.0–40.0)

## 2018-07-10 LAB — TSH: TSH: 1.19 u[IU]/mL (ref 0.35–4.50)

## 2018-07-10 MED ORDER — ESCITALOPRAM OXALATE 10 MG PO TABS: 10.0000 mg | ORAL_TABLET | Freq: Every day | ORAL | 2 refills | Status: DC

## 2018-07-10 NOTE — Progress Notes (Signed)
Subjective:    Patient ID: Bonnie Gutierrez, female    DOB: 12-15-1990, 28 y.o.   MRN: [id number]  HPI Here for a well exam. She feels fine physically but she has lost about 14 lbs over the past year and a half without trying. She thinks it may be due anxiety and depression. 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 Her appetite is down and she has trouble sleeping. She has started walking wit a friend and exercise helps her feel better.    Review of Systems  Constitutional: Positive for appetite change and unexpected weight change.  HENT: Negative.   Eyes: Negative.   Respiratory: Negative.   Cardiovascular: Negative.   Gastrointestinal: Negative.   Genitourinary: Negative for decreased urine volume, difficulty urinating, dyspareunia, dysuria, enuresis, flank pain, frequency, hematuria, pelvic pain and urgency.  Musculoskeletal: Negative.   Skin: Negative.   Neurological: Negative.   Psychiatric/Behavioral: Negative.        Objective:   Physical Exam Constitutional:      General: She is not in acute distress.    Appearance: She is well-developed.  HENT:     Head: Normocephalic and atraumatic.     Right Ear: External ear normal.     Left Ear: External ear normal.     Nose: Nose normal.     Mouth/Throat:     Pharynx: No oropharyngeal exudate.  Eyes:     General: No scleral icterus.    Conjunctiva/sclera: Conjunctivae normal.     Pupils: Pupils are equal, round, and reactive to light.  Neck:     Musculoskeletal: Normal range of motion and neck supple.     Thyroid: No thyromegaly.     Vascular: No JVD.  Cardiovascular:     Rate and Rhythm: Normal rate and regular rhythm.     Heart sounds: Normal heart sounds. No murmur. No friction rub. No gallop.   Pulmonary:     Effort: Pulmonary effort is normal. No respiratory distress.     Breath sounds: Normal breath sounds. No wheezing or  rales.  Chest:     Chest wall: No tenderness.  Abdominal:     General: Bowel sounds are normal. There is no distension.     Palpations: Abdomen is soft. There is no mass.     Tenderness: There is no abdominal tenderness. There is no guarding or rebound.  Musculoskeletal: Normal range of motion.        General: No tenderness.  Lymphadenopathy:     Cervical: No cervical adenopathy.  Skin:    General: Skin is warm and dry.     Findings: No erythema or rash.  Neurological:     Mental Status: She is alert and oriented to person, place, and time.     Cranial Nerves: No cranial nerve deficit.     Motor: No abnormal muscle tone.     Coordination: Coordination normal.     Deep Tendon Reflexes: Reflexes are normal and symmetric. Reflexes normal.  Psychiatric:        Behavior: Behavior normal.        Thought Content: Thought content normal.        Judgment: Judgment normal.           Assessment & Plan:  Well exam. We discussed diet and exercise. We will get fasting labs to check for any metabolic issues but I think her weight  loss os clearly the result of stress. I encouraged her to exercise daily. She will start on Lexapro 10 mg daily also. Recheck in 3-4 weeks. Gershon Crane, MD

## 2018-09-15 ENCOUNTER — Encounter: Payer: Self-pay | Admitting: Family Medicine

## 2018-09-18 MED ORDER — ESCITALOPRAM OXALATE 10 MG PO TABS: 10.0000 mg | ORAL_TABLET | Freq: Every day | ORAL | 5 refills | Status: DC

## 2018-10-03 ENCOUNTER — Encounter: Payer: Self-pay | Admitting: Family Medicine

## 2018-10-09 NOTE — Telephone Encounter (Signed)
Spoke with the patient. Appointment has been scheduled.  

## 2018-10-10 ENCOUNTER — Encounter: Payer: Self-pay | Admitting: Family Medicine

## 2018-10-10 ENCOUNTER — Ambulatory Visit (INDEPENDENT_AMBULATORY_CARE_PROVIDER_SITE_OTHER): Payer: BC Managed Care – PPO | Admitting: Family Medicine

## 2018-10-10 ENCOUNTER — Other Ambulatory Visit: Payer: Self-pay

## 2018-10-10 DIAGNOSIS — J019 Acute sinusitis, unspecified: Secondary | ICD-10-CM

## 2018-10-10 MED ORDER — AZITHROMYCIN 250 MG PO TABS: ORAL_TABLET | ORAL | 0 refills | Status: DC

## 2018-10-10 NOTE — Progress Notes (Signed)
   Subjective:    Patient ID: Bonnie Gutierrez, female    DOB: 1990/11/18, 28 y.o.   MRN: [id number]  HPI Here for 8 days of sinus congestion, PND, a dry cough, and blowing yellow mucus from the nose. No fever. Using Mucinex D and Nasonex. Virtual Visit via Video Note  I connected with the patient on 10/10/18 at  1:15 PM EDT by a video enabled telemedicine application and verified that I am speaking with the correct person using two identifiers.  Location patient: home Location provider:work or home office Persons participating in the virtual visit: patient, provider  I discussed the limitations of evaluation and management by telemedicine and the availability of in person appointments. The patient expressed understanding and agreed to proceed.   HPI:    ROS: See pertinent positives and negatives per HPI.  Past Medical History:  Diagnosis Date  . Acne vulgaris   . Chicken pox   . Depression   . Dysmenorrhea   . GERD (gastroesophageal reflux disease)   . Routine gynecological examination    sees Dr. Jethro Poling in [facility]   . Torn ACL (anterior cruciate ligament)    left foot Dr Lestine Box  2008    History reviewed. No pertinent surgical history.  Family History  Problem Relation Age of Onset  . Allergies Unknown        family hx  . Depression Unknown        family hx     Current Outpatient Medications:  .  drospirenone-ethinyl estradiol (YAZ,GIANVI,LORYNA) 3-0.02 MG tablet, Take by mouth., Disp: , Rfl:  .  escitalopram (LEXAPRO) 10 MG tablet, Take 1 tablet (10 mg total) by mouth daily., Disp: 30 tablet, Rfl: 5 .  Multiple Vitamins-Minerals (WOMENS MULTIVITAMIN) TABS, Take 1 tablet by mouth daily., Disp: , Rfl:  .  azithromycin (ZITHROMAX Z-PAK) 250 MG tablet, As directed, Disp: 6 each, Rfl: 0  EXAM:  VITALS per patient if applicable:  GENERAL: alert, oriented, appears well and in no acute distress  HEENT: atraumatic, conjunttiva clear, no obvious  abnormalities on inspection of external nose and ears  NECK: normal movements of the head and neck  LUNGS: on inspection no signs of respiratory distress, breathing rate appears normal, no obvious gross SOB, gasping or wheezing  CV: no obvious cyanosis  MS: moves all visible extremities without noticeable abnormality  PSYCH/NEURO: pleasant and cooperative, no obvious depression or anxiety, speech and thought processing grossly intact  ASSESSMENT AND PLAN: Sinusitis, treat with a Zpack.  Gershon Crane, MD  Discussed the following assessment and plan:  No diagnosis found.     I discussed the assessment and treatment plan with the patient. The patient was provided an opportunity to ask questions and all were answered. The patient agreed with the plan and demonstrated an understanding of the instructions.   The patient was advised to call back or seek an in-person evaluation if the symptoms worsen or if the condition fails to improve as anticipated.    Review of Systems     Objective:   Physical Exam        Assessment & Plan:

## 2019-05-02 ENCOUNTER — Encounter: Payer: Self-pay | Admitting: Family Medicine

## 2019-05-02 MED ORDER — ESCITALOPRAM OXALATE 10 MG PO TABS: 10.0000 mg | ORAL_TABLET | Freq: Every day | ORAL | 0 refills | Status: DC

## 2019-05-08 ENCOUNTER — Other Ambulatory Visit: Payer: Self-pay

## 2019-05-09 ENCOUNTER — Ambulatory Visit (INDEPENDENT_AMBULATORY_CARE_PROVIDER_SITE_OTHER): Payer: BC Managed Care – PPO | Admitting: *Deleted

## 2019-05-09 DIAGNOSIS — Z23 Encounter for immunization: Secondary | ICD-10-CM | POA: Diagnosis not present

## 2019-05-30 ENCOUNTER — Other Ambulatory Visit: Payer: BC Managed Care – PPO

## 2019-05-30 ENCOUNTER — Ambulatory Visit: Payer: BC Managed Care – PPO | Attending: Internal Medicine

## 2019-05-30 DIAGNOSIS — Z20822 Contact with and (suspected) exposure to covid-19: Secondary | ICD-10-CM

## 2019-05-31 ENCOUNTER — Other Ambulatory Visit: Payer: Self-pay | Admitting: Family Medicine

## 2019-05-31 LAB — NOVEL CORONAVIRUS, NAA: SARS-CoV-2, NAA: NOT DETECTED

## 2019-06-26 ENCOUNTER — Encounter: Payer: Self-pay | Admitting: Family Medicine

## 2019-06-26 NOTE — Telephone Encounter (Signed)
The best thing is to rinse her mouth with warm (not hot) saltwater every couple of hours for a few days

## 2019-07-07 ENCOUNTER — Other Ambulatory Visit: Payer: Self-pay | Admitting: Family Medicine

## 2019-08-10 ENCOUNTER — Other Ambulatory Visit: Payer: Self-pay | Admitting: Family Medicine

## 2019-09-08 ENCOUNTER — Other Ambulatory Visit: Payer: Self-pay | Admitting: Family Medicine

## 2019-09-10 NOTE — Telephone Encounter (Signed)
Patient need to schedule an ov for more refills. 

## 2019-09-12 ENCOUNTER — Other Ambulatory Visit: Payer: Self-pay | Admitting: Family Medicine

## 2019-09-12 NOTE — Telephone Encounter (Signed)
Message routed to PCP CMA  

## 2019-09-13 ENCOUNTER — Other Ambulatory Visit: Payer: Self-pay | Admitting: Family Medicine

## 2019-09-17 ENCOUNTER — Encounter: Payer: Self-pay | Admitting: Family Medicine

## 2019-09-17 MED ORDER — ESCITALOPRAM OXALATE 5 MG PO TABS: 5.0000 mg | ORAL_TABLET | Freq: Every day | ORAL | 2 refills | Status: DC

## 2019-09-17 NOTE — Telephone Encounter (Signed)
We can decrease the Lexapro to 5 mg daily. Call in #30 with 2 rf

## 2019-12-21 ENCOUNTER — Other Ambulatory Visit: Payer: Self-pay | Admitting: Family Medicine

## 2020-01-08 ENCOUNTER — Other Ambulatory Visit: Payer: BC Managed Care – PPO

## 2020-01-08 ENCOUNTER — Other Ambulatory Visit: Payer: Self-pay | Admitting: Critical Care Medicine

## 2020-01-08 DIAGNOSIS — Z20822 Contact with and (suspected) exposure to covid-19: Secondary | ICD-10-CM

## 2020-01-09 LAB — SARS-COV-2, NAA 2 DAY TAT

## 2020-01-09 LAB — NOVEL CORONAVIRUS, NAA: SARS-CoV-2, NAA: NOT DETECTED

## 2020-03-14 ENCOUNTER — Ambulatory Visit: Payer: BC Managed Care – PPO | Attending: Internal Medicine

## 2020-03-14 DIAGNOSIS — Z23 Encounter for immunization: Secondary | ICD-10-CM

## 2020-03-14 NOTE — Progress Notes (Signed)
   Covid-19 Vaccination Clinic  Name:  Bonnie Gutierrez    MRN: [id number] DOB: 1991/04/02  03/14/2020  Bonnie Gutierrez was observed post Covid-19 immunization for 15 minutes without incident. She was provided with Vaccine Information Sheet and instruction to access the V-Safe system.   Bonnie Gutierrez was instructed to call 911 with any severe reactions post vaccine: Marland Kitchen Difficulty breathing  . Swelling of face and throat  . A fast heartbeat  . A bad rash all over body  . Dizziness and weakness

## 2020-03-19 ENCOUNTER — Other Ambulatory Visit: Payer: Self-pay | Admitting: Family Medicine

## 2020-03-20 NOTE — Telephone Encounter (Signed)
Last OV 10/10/18 Last refill 2 months ago  Please advise

## 2020-09-01 ENCOUNTER — Encounter: Payer: Self-pay | Admitting: Family Medicine

## 2020-09-01 ENCOUNTER — Telehealth: Payer: BC Managed Care – PPO | Admitting: Family Medicine

## 2020-09-01 DIAGNOSIS — U071 COVID-19: Secondary | ICD-10-CM | POA: Diagnosis not present

## 2020-09-01 NOTE — Progress Notes (Signed)
   Subjective:    Patient ID: Bonnie Gutierrez, female    DOB: 12/24/1990, 30 y.o.   MRN: [id number]  HPI Virtual Visit via Video Note  I connected with the patient on 09/01/20 at 10:15 AM EDT by a video enabled telemedicine application and verified that I am speaking with the correct person using two identifiers.  Location patient: home Location provider:work or home office Persons participating in the virtual visit: patient, provider  I discussed the limitations of evaluation and management by telemedicine and the availability of in person appointments. The patient expressed understanding and agreed to proceed.   HPI: Here for 3 days of stuffy head, ST, chest congestion and a dry cough. No fever or body aches. No SOB or NVD. Drinking fluids and taking Flonase and Mucinex. She tested positive for the Covid virus at home this morning.    ROS: See pertinent positives and negatives per HPI.  Past Medical History:  Diagnosis Date  . Acne vulgaris   . Chicken pox   . Depression   . Dysmenorrhea   . GERD (gastroesophageal reflux disease)   . Routine gynecological examination    sees Dr. Jethro Poling in [facility]   . Torn ACL (anterior cruciate ligament)    left foot Dr Lestine Box  2008    History reviewed. No pertinent surgical history.  Family History  Problem Relation Age of Onset  . Allergies Other        family hx  . Depression Other        family hx     Current Outpatient Medications:  .  drospirenone-ethinyl estradiol (YAZ,GIANVI,LORYNA) 3-0.02 MG tablet, Take by mouth., Disp: , Rfl:  .  escitalopram (LEXAPRO) 5 MG tablet, TAKE 1 TABLET(5 MG) BY MOUTH DAILY, Disp: 90 tablet, Rfl: 3 .  Multiple Vitamins-Minerals (WOMENS MULTIVITAMIN) TABS, Take 1 tablet by mouth daily., Disp: , Rfl:   EXAM:  VITALS per patient if applicable:  GENERAL: alert, oriented, appears well and in no acute distress  HEENT: atraumatic, conjunttiva clear, no obvious abnormalities on  inspection of external nose and ears  NECK: normal movements of the head and neck  LUNGS: on inspection no signs of respiratory distress, breathing rate appears normal, no obvious gross SOB, gasping or wheezing  CV: no obvious cyanosis  MS: moves all visible extremities without noticeable abnormality  PSYCH/NEURO: pleasant and cooperative, no obvious depression or anxiety, speech and thought processing grossly intact  ASSESSMENT AND PLAN: Covid-19 illness. She seems to be doing well. She can add Delsym as needed for the cough. 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  Gershon Crane, MD  Discussed the following assessment and plan:  No diagnosis found.     I discussed the assessment and treatment plan with the patient. The patient was provided an opportunity to ask questions and all were answered. The patient agreed with the plan and demonstrated an understanding of the instructions.   The patient was advised to call back or seek an in-person evaluation if the symptoms worsen or if the condition fails to improve as anticipated.     Review of Systems     Objective:   Physical Exam        Assessment & Plan:

## 2021-01-10 ENCOUNTER — Other Ambulatory Visit: Payer: Self-pay | Admitting: Family Medicine

## 2021-05-04 ENCOUNTER — Ambulatory Visit (INDEPENDENT_AMBULATORY_CARE_PROVIDER_SITE_OTHER): Payer: BC Managed Care – PPO | Admitting: Family Medicine

## 2021-05-04 ENCOUNTER — Encounter: Payer: Self-pay | Admitting: Family Medicine

## 2021-05-04 VITALS — BP 98/64 | HR 57 | Temp 98.9°F | Ht 63.0 in | Wt 117.4 lb

## 2021-05-04 DIAGNOSIS — F419 Anxiety disorder, unspecified: Secondary | ICD-10-CM

## 2021-05-04 DIAGNOSIS — Z0000 Encounter for general adult medical examination without abnormal findings: Secondary | ICD-10-CM | POA: Diagnosis not present

## 2021-05-04 DIAGNOSIS — Z23 Encounter for immunization: Secondary | ICD-10-CM | POA: Diagnosis not present

## 2021-05-04 MED ORDER — ESCITALOPRAM OXALATE 20 MG PO TABS: 20.0000 mg | ORAL_TABLET | Freq: Every day | ORAL | 3 refills | Status: DC

## 2021-05-04 NOTE — Progress Notes (Signed)
Subjective:    Patient ID: Bonnie Gutierrez, female    DOB: May 10, 1991, 30 y.o.   MRN: [id number]  HPI Here for a well exam. She feels well, but her anxiety has been more of a problem lately. 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 She has been taking Lexapro 5 mg daily, and she wants to increase the dose.    Review of Systems  Constitutional: Negative.   HENT: Negative.    Eyes: Negative.   Respiratory: Negative.    Cardiovascular: Negative.   Gastrointestinal: Negative.   Genitourinary:  Negative for decreased urine volume, difficulty urinating, dyspareunia, dysuria, enuresis, flank pain, frequency, hematuria, pelvic pain and urgency.  Musculoskeletal: Negative.   Skin: Negative.   Neurological: Negative.  Negative for headaches.  Psychiatric/Behavioral:  Negative for agitation, behavioral problems, confusion, decreased concentration, dysphoric mood, hallucinations and sleep disturbance. The patient is nervous/anxious.       Objective:   Physical Exam Constitutional:      General: She is not in acute distress.    Appearance: Normal appearance. She is well-developed.  HENT:     Head: Normocephalic and atraumatic.     Right Ear: External ear normal.     Left Ear: External ear normal.     Nose: Nose normal.     Mouth/Throat:     Pharynx: No oropharyngeal exudate.  Eyes:     General: No scleral icterus.    Conjunctiva/sclera: Conjunctivae normal.     Pupils: Pupils are equal, round, and reactive to light.  Neck:     Thyroid: No thyromegaly.     Vascular: No JVD.  Cardiovascular:     Rate and Rhythm: Normal rate and regular rhythm.     Heart sounds: Normal heart sounds. No murmur heard.   No friction rub. No gallop.  Pulmonary:     Effort: Pulmonary effort is normal. No respiratory distress.     Breath sounds: Normal breath sounds. No wheezing or rales.  Chest:     Chest wall: No tenderness.  Abdominal:     General: Bowel sounds are normal.  There is no distension.     Palpations: Abdomen is soft. There is no mass.     Tenderness: There is no abdominal tenderness. There is no guarding or rebound.  Musculoskeletal:        General: No tenderness. Normal range of motion.     Cervical back: Normal range of motion and neck supple.  Lymphadenopathy:     Cervical: No cervical adenopathy.  Skin:    General: Skin is warm and dry.     Findings: No erythema or rash.  Neurological:     Mental Status: She is alert and oriented to person, place, and time.     Cranial Nerves: No cranial nerve deficit.     Motor: No abnormal muscle tone.     Coordination: Coordination normal.     Deep Tendon Reflexes: Reflexes are normal and symmetric. Reflexes normal.  Psychiatric:        Mood and Affect: Mood normal.        Behavior: Behavior normal.        Thought Content: Thought content normal.        Judgment: Judgment normal.          Assessment & Plan:  Well exam. We discussed diet and exercise. Get fasting labs soon. For the anxiety, we will increase the Lexapro to 20 mg daily. She will report back  in 4 weeks.  Alysia Penna, MD

## 2021-05-05 ENCOUNTER — Other Ambulatory Visit (INDEPENDENT_AMBULATORY_CARE_PROVIDER_SITE_OTHER): Payer: BC Managed Care – PPO

## 2021-05-05 DIAGNOSIS — Z0000 Encounter for general adult medical examination without abnormal findings: Secondary | ICD-10-CM | POA: Diagnosis not present

## 2021-05-05 LAB — CBC WITH DIFFERENTIAL/PLATELET
Basophils Absolute: 0 10*3/uL (ref 0.0–0.1)
Basophils Relative: 1.3 % (ref 0.0–3.0)
Eosinophils Absolute: 0 10*3/uL (ref 0.0–0.7)
Eosinophils Relative: 1.2 % (ref 0.0–5.0)
HCT: 39.8 % (ref 36.0–46.0)
Hemoglobin: 13.2 g/dL (ref 12.0–15.0)
Lymphocytes Relative: 35.8 % (ref 12.0–46.0)
Lymphs Abs: 1.1 10*3/uL (ref 0.7–4.0)
MCHC: 33.3 g/dL (ref 30.0–36.0)
MCV: 89 fl (ref 78.0–100.0)
Monocytes Absolute: 0.2 10*3/uL (ref 0.1–1.0)
Monocytes Relative: 6.7 % (ref 3.0–12.0)
Neutro Abs: 1.7 10*3/uL (ref 1.4–7.7)
Neutrophils Relative %: 55 % (ref 43.0–77.0)
Platelets: 145 10*3/uL — ABNORMAL LOW (ref 150.0–400.0)
RBC: 4.47 Mil/uL (ref 3.87–5.11)
RDW: 13.2 % (ref 11.5–15.5)
WBC: 3.1 10*3/uL — ABNORMAL LOW (ref 4.0–10.5)

## 2021-05-05 LAB — BASIC METABOLIC PANEL
BUN: 15 mg/dL (ref 6–23)
CO2: 28 mEq/L (ref 19–32)
Calcium: 9.1 mg/dL (ref 8.4–10.5)
Chloride: 104 mEq/L (ref 96–112)
Creatinine, Ser: 0.82 mg/dL (ref 0.40–1.20)
GFR: 94.75 mL/min (ref >60.00)
Glucose, Bld: 87 mg/dL (ref 70–99)
Potassium: 3.9 mEq/L (ref 3.5–5.1)
Sodium: 138 mEq/L (ref 135–145)

## 2021-05-05 LAB — LIPID PANEL
Cholesterol: 188 mg/dL (ref 0–200)
HDL: 96.7 mg/dL (ref >39.00)
LDL Cholesterol: 77 mg/dL (ref 0–99)
NonHDL: 91.69
Total CHOL/HDL Ratio: 2
Triglycerides: 72 mg/dL (ref 0.0–149.0)
VLDL: 14.4 mg/dL (ref 0.0–40.0)

## 2021-05-05 LAB — HEPATIC FUNCTION PANEL
ALT: 11 U/L (ref 0–35)
AST: 18 U/L (ref 0–37)
Albumin: 3.8 g/dL (ref 3.5–5.2)
Alkaline Phosphatase: 39 U/L (ref 39–117)
Bilirubin, Direct: 0.1 mg/dL (ref 0.0–0.3)
Total Bilirubin: 0.6 mg/dL (ref 0.2–1.2)
Total Protein: 6.4 g/dL (ref 6.0–8.3)

## 2021-05-05 LAB — TSH: TSH: 1.2 u[IU]/mL (ref 0.35–5.50)

## 2021-05-05 LAB — HEMOGLOBIN A1C: Hgb A1c MFr Bld: 5.1 % (ref 4.6–6.5)

## 2022-04-23 ENCOUNTER — Encounter: Payer: Self-pay | Admitting: Family Medicine

## 2022-04-25 ENCOUNTER — Other Ambulatory Visit: Payer: Self-pay

## 2022-04-25 MED ORDER — TRIAMCINOLONE ACETONIDE 0.1 % EX CREA: 1.0000 | TOPICAL_CREAM | Freq: Three times a day (TID) | CUTANEOUS | 2 refills | Status: DC | PRN

## 2022-04-25 NOTE — Telephone Encounter (Signed)
This looks like eczema. Call in Triamcinolone 0.1 % cream to apply TID as needed, 30 grams with 2 rf

## 2022-05-06 ENCOUNTER — Other Ambulatory Visit: Payer: Self-pay | Admitting: Family Medicine

## 2022-06-27 ENCOUNTER — Encounter: Payer: BC Managed Care – PPO | Admitting: Family Medicine

## 2022-07-25 ENCOUNTER — Encounter: Payer: Self-pay | Admitting: Family Medicine

## 2022-07-25 ENCOUNTER — Ambulatory Visit (INDEPENDENT_AMBULATORY_CARE_PROVIDER_SITE_OTHER): Payer: BC Managed Care – PPO | Admitting: Family Medicine

## 2022-07-25 VITALS — BP 98/64 | HR 76 | Temp 98.9°F | Ht 63.5 in | Wt 127.0 lb

## 2022-07-25 DIAGNOSIS — Z0000 Encounter for general adult medical examination without abnormal findings: Secondary | ICD-10-CM | POA: Diagnosis not present

## 2022-07-25 LAB — HEPATIC FUNCTION PANEL
ALT: 14 U/L (ref 0–35)
AST: 21 U/L (ref 0–37)
Albumin: 3.9 g/dL (ref 3.5–5.2)
Alkaline Phosphatase: 44 U/L (ref 39–117)
Bilirubin, Direct: 0 mg/dL (ref 0.0–0.3)
Total Bilirubin: 0.3 mg/dL (ref 0.2–1.2)
Total Protein: 6.9 g/dL (ref 6.0–8.3)

## 2022-07-25 LAB — LIPID PANEL
Cholesterol: 191 mg/dL (ref 0–200)
HDL: 105 mg/dL (ref >39.00)
LDL Cholesterol: 73 mg/dL (ref 0–99)
NonHDL: 85.6
Total CHOL/HDL Ratio: 2
Triglycerides: 61 mg/dL (ref 0.0–149.0)
VLDL: 12.2 mg/dL (ref 0.0–40.0)

## 2022-07-25 LAB — BASIC METABOLIC PANEL
BUN: 18 mg/dL (ref 6–23)
CO2: 25 mEq/L (ref 19–32)
Calcium: 9.2 mg/dL (ref 8.4–10.5)
Chloride: 104 mEq/L (ref 96–112)
Creatinine, Ser: 0.83 mg/dL (ref 0.40–1.20)
GFR: 92.58 mL/min (ref >60.00)
Glucose, Bld: 90 mg/dL (ref 70–99)
Potassium: 4.5 mEq/L (ref 3.5–5.1)
Sodium: 136 mEq/L (ref 135–145)

## 2022-07-25 LAB — CBC WITH DIFFERENTIAL/PLATELET
Basophils Absolute: 0.1 10*3/uL (ref 0.0–0.1)
Basophils Relative: 1.4 % (ref 0.0–3.0)
Eosinophils Absolute: 0.1 10*3/uL (ref 0.0–0.7)
Eosinophils Relative: 1.8 % (ref 0.0–5.0)
HCT: 41.2 % (ref 36.0–46.0)
Hemoglobin: 13.7 g/dL (ref 12.0–15.0)
Lymphocytes Relative: 38.6 % (ref 12.0–46.0)
Lymphs Abs: 1.4 10*3/uL (ref 0.7–4.0)
MCHC: 33.4 g/dL (ref 30.0–36.0)
MCV: 89.7 fl (ref 78.0–100.0)
Monocytes Absolute: 0.2 10*3/uL (ref 0.1–1.0)
Monocytes Relative: 5.2 % (ref 3.0–12.0)
Neutro Abs: 1.9 10*3/uL (ref 1.4–7.7)
Neutrophils Relative %: 53 % (ref 43.0–77.0)
Platelets: 199 10*3/uL (ref 150.0–400.0)
RBC: 4.59 Mil/uL (ref 3.87–5.11)
RDW: 12.7 % (ref 11.5–15.5)
WBC: 3.6 10*3/uL — ABNORMAL LOW (ref 4.0–10.5)

## 2022-07-25 LAB — HEMOGLOBIN A1C: Hgb A1c MFr Bld: 5.2 % (ref 4.6–6.5)

## 2022-07-25 LAB — TSH: TSH: 1.04 u[IU]/mL (ref 0.35–5.50)

## 2022-07-25 MED ORDER — ESCITALOPRAM OXALATE 20 MG PO TABS: ORAL_TABLET | ORAL | 3 refills | Status: DC

## 2022-07-25 NOTE — Progress Notes (Signed)
   Subjective:    Patient ID: Bonnie Gutierrez, female    DOB: 03-19-1991, 32 y.o.   MRN: [id number]  HPI Here for a well exam. She feels great and has no concerns.    Review of Systems  Constitutional: Negative.   HENT: Negative.    Eyes: Negative.   Respiratory: Negative.    Cardiovascular: Negative.   Gastrointestinal: Negative.   Genitourinary:  Negative for decreased urine volume, difficulty urinating, dyspareunia, dysuria, enuresis, flank pain, frequency, hematuria, pelvic pain and urgency.  Musculoskeletal: Negative.   Skin: Negative.   Neurological: Negative.  Negative for headaches.  Psychiatric/Behavioral: Negative.         Objective:   Physical Exam Constitutional:      General: She is not in acute distress.    Appearance: Normal appearance. She is well-developed.  HENT:     Head: Normocephalic and atraumatic.     Right Ear: External ear normal.     Left Ear: External ear normal.     Nose: Nose normal.     Mouth/Throat:     Pharynx: No oropharyngeal exudate.  Eyes:     General: No scleral icterus.    Conjunctiva/sclera: Conjunctivae normal.     Pupils: Pupils are equal, round, and reactive to light.  Neck:     Thyroid: No thyromegaly.     Vascular: No JVD.  Cardiovascular:     Rate and Rhythm: Normal rate and regular rhythm.     Heart sounds: Normal heart sounds. No murmur heard.    No friction rub. No gallop.  Pulmonary:     Effort: Pulmonary effort is normal. No respiratory distress.     Breath sounds: Normal breath sounds. No wheezing or rales.  Chest:     Chest wall: No tenderness.  Abdominal:     General: Bowel sounds are normal. There is no distension.     Palpations: Abdomen is soft. There is no mass.     Tenderness: There is no abdominal tenderness. There is no guarding or rebound.  Musculoskeletal:        General: No tenderness. Normal range of motion.     Cervical back: Normal range of motion and neck supple.  Lymphadenopathy:      Cervical: No cervical adenopathy.  Skin:    General: Skin is warm and dry.     Findings: No erythema or rash.  Neurological:     Mental Status: She is alert and oriented to person, place, and time.     Cranial Nerves: No cranial nerve deficit.     Motor: No abnormal muscle tone.     Coordination: Coordination normal.     Deep Tendon Reflexes: Reflexes are normal and symmetric. Reflexes normal.  Psychiatric:        Behavior: Behavior normal.        Thought Content: Thought content normal.        Judgment: Judgment normal.           Assessment & Plan:  Well exam. We discussed diet and exercise. Get fasting labs. Alysia Penna, MD

## 2023-04-20 ENCOUNTER — Telehealth: Payer: BC Managed Care – PPO | Admitting: Physician Assistant

## 2023-04-20 DIAGNOSIS — B9689 Other specified bacterial agents as the cause of diseases classified elsewhere: Secondary | ICD-10-CM | POA: Diagnosis not present

## 2023-04-20 DIAGNOSIS — J019 Acute sinusitis, unspecified: Secondary | ICD-10-CM

## 2023-04-20 MED ORDER — AMOXICILLIN-POT CLAVULANATE 875-125 MG PO TABS: 1.0000 | ORAL_TABLET | Freq: Two times a day (BID) | ORAL | 0 refills | Status: DC

## 2023-04-20 NOTE — Progress Notes (Signed)
Virtual Visit Consent   Bonnie Gutierrez, you are scheduled for a virtual visit with a [facility] provider today. Just as with appointments in the office, your consent must be obtained to participate. Your consent will be active for this visit and any virtual visit you may have with one of our providers in the next 365 days. If you have a MyChart account, a copy of this consent can be sent to you electronically.  As this is a virtual visit, video technology does not allow for your provider to perform a traditional examination. This may limit your provider's ability to fully assess your condition. If your provider identifies any concerns that need to be evaluated in person or the need to arrange testing (such as labs, EKG, etc.), we will make arrangements to do so. Although advances in technology are sophisticated, we cannot ensure that it will always work on either your end or our end. If the connection with a video visit is poor, the visit may have to be switched to a telephone visit. With either a video or telephone visit, we are not always able to ensure that we have a secure connection.  By engaging in this virtual visit, you consent to the provision of healthcare and authorize for your insurance to be billed (if applicable) for the services provided during this visit. Depending on your insurance coverage, you may receive a charge related to this service.  I need to obtain your verbal consent now. Are you willing to proceed with your visit today? Bonnie Gutierrez has provided verbal consent on 04/20/2023 for a virtual visit (video or telephone). Margaretann Loveless, PA-C  Date: 04/20/2023 9:35 AM  Virtual Visit via Video Note   I, Margaretann Loveless, connected with  Bonnie Gutierrez  ([id number], 1990-08-22) on 04/20/23 at  9:30 AM EST by a video-enabled telemedicine application and verified that I am speaking with the correct person using two identifiers.  Location: Patient: Virtual Visit  Location Patient: Other: work; isolated Provider: Engineer, mining Provider: Home Office   I discussed the limitations of evaluation and management by telemedicine and the availability of in person appointments. The patient expressed understanding and agreed to proceed.    History of Present Illness: [name] is a 32 y.o. who identifies as a female who was assigned female at birth, and is being seen today for URI symptoms.  HPI: URI  This is a new problem. The current episode started in the past 7 days. The problem has been gradually worsening. There has been no fever. Associated symptoms include congestion, coughing, ear pain, headaches, a plugged ear sensation, rhinorrhea, sinus pain and a sore throat. Pertinent negatives include no chest pain, diarrhea, nausea, vomiting or wheezing. Treatments tried: dayquil, nyquil, mucinex, flonase. The treatment provided no relief.    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  Problems:  Patient Active Problem List   Diagnosis Date Noted   Anxiety disorder 05/04/2021   COVID-19 virus infection 09/01/2020   CONCUSSION WITH NO LOSS OF CONSCIOUSNESS 02/15/2010   METRORRHAGIA 06/17/2009   GERD 04/06/2007   ACNE, MILD 04/06/2007    Allergies:  Allergies  Allergen Reactions   Sulfonamide Derivatives     REACTION: rash   Medications:  Current Outpatient Medications:    amoxicillin-clavulanate (AUGMENTIN) 875-125 MG tablet, Take 1 tablet by mouth 2 (two) times daily., Disp: 20 tablet, Rfl: 0   drospirenone-ethinyl estradiol (YAZ,GIANVI,LORYNA) 3-0.02 MG tablet, Take by mouth., Disp: ,  Rfl:    escitalopram (LEXAPRO) 20 MG tablet, TAKE 1 TABLET(20 MG) BY MOUTH DAILY, Disp: 90 tablet, Rfl: 3   Multiple Vitamins-Minerals (WOMENS MULTIVITAMIN) TABS, Take 1 tablet by mouth daily., Disp: , Rfl:    triamcinolone cream (KENALOG) 0.1 %, Apply 1 Application topically 3 (three) times daily as needed., Disp: 30 g, Rfl:  2  Observations/Objective: Patient is well-developed, well-nourished in no acute distress.  Resting comfortably  Head is normocephalic, atraumatic.  No labored breathing.  Speech is clear and coherent with logical content.  Patient is alert and oriented at baseline.    Assessment and Plan: 1. Acute bacterial sinusitis - amoxicillin-clavulanate (AUGMENTIN) 875-125 MG tablet; Take 1 tablet by mouth 2 (two) times daily.  Dispense: 20 tablet; Refill: 0  - Worsening symptoms that have not responded to OTC medications.  - Will give Augmentin - Continue Flonase - Continue Mucinex - Steam and humidifier can help - Stay well hydrated and get plenty of rest.  - Seek in person evaluation if no symptom improvement or if symptoms worsen   Follow Up Instructions: I discussed the assessment and treatment plan with the patient. The patient was provided an opportunity to ask questions and all were answered. The patient agreed with the plan and demonstrated an understanding of the instructions.  A copy of instructions were sent to the patient via MyChart unless otherwise noted below.    The patient was advised to call back or seek an in-person evaluation if the symptoms worsen or if the condition fails to improve as anticipated.    Margaretann Loveless, PA-C

## 2023-04-20 NOTE — Patient Instructions (Signed)
Bonnie Gutierrez, thank you for joining Margaretann Loveless, PA-C for today's virtual visit.  While this provider is not your primary care provider (PCP), if your PCP is located in our provider database this encounter information will be shared with them immediately following your visit.   A [facility] MyChart account gives you access to today's visit and all your visits, tests, and labs performed at [facility] " click here if you don't have a  MyChart account or go to [email or web address]  Consent: (Patient) Bonnie Gutierrez provided verbal consent for this virtual visit at the beginning of the encounter.  Current Medications:  Current Outpatient Medications:    amoxicillin-clavulanate (AUGMENTIN) 875-125 MG tablet, Take 1 tablet by mouth 2 (two) times daily., Disp: 20 tablet, Rfl: 0   drospirenone-ethinyl estradiol (YAZ,GIANVI,LORYNA) 3-0.02 MG tablet, Take by mouth., Disp: , Rfl:    escitalopram (LEXAPRO) 20 MG tablet, TAKE 1 TABLET(20 MG) BY MOUTH DAILY, Disp: 90 tablet, Rfl: 3   Multiple Vitamins-Minerals (WOMENS MULTIVITAMIN) TABS, Take 1 tablet by mouth daily., Disp: , Rfl:    triamcinolone cream (KENALOG) 0.1 %, Apply 1 Application topically 3 (three) times daily as needed., Disp: 30 g, Rfl: 2   Medications ordered in this encounter:  Meds ordered this encounter  Medications   amoxicillin-clavulanate (AUGMENTIN) 875-125 MG tablet    Sig: Take 1 tablet by mouth 2 (two) times daily.    Dispense:  20 tablet    Refill:  0    Order Specific Question:   Supervising Provider    Answer:   Merrilee Jansky [id number]     *If you need refills on other medications prior to your next appointment, please contact your pharmacy*  Follow-Up: Call back or seek an in-person evaluation if the symptoms worsen or if the condition fails to improve as anticipated.   Virtual Care [phone]  Other Instructions Sinus Infection, Adult A sinus  infection, also called sinusitis, is inflammation of your sinuses. Sinuses are hollow spaces in the bones around your face. Your sinuses are located: Around your eyes. In the middle of your forehead. Behind your nose. In your cheekbones. Mucus normally drains out of your sinuses. When your nasal tissues become inflamed or swollen, mucus can become trapped or blocked. This allows bacteria, viruses, and fungi to grow, which leads to infection. Most infections of the sinuses are caused by a virus. A sinus infection can develop quickly. It can last for up to 4 weeks (acute) or for more than 12 weeks (chronic). A sinus infection often develops after a cold. What are the causes? This condition is caused by anything that creates swelling in the sinuses or stops mucus from draining. This includes: Allergies. Asthma. Infection from bacteria or viruses. Deformities or blockages in your nose or sinuses. Abnormal growths in the nose (nasal polyps). Pollutants, such as chemicals or irritants in the air. Infection from fungi. This is rare. What increases the risk? You are more likely to develop this condition if you: Have a weak body defense system (immune system). Do a lot of swimming or diving. Overuse nasal sprays. Smoke. What are the signs or symptoms? The main symptoms of this condition are pain and a feeling of pressure around the affected sinuses. Other symptoms include: Stuffy nose or congestion that makes it difficult to breathe through your nose. Thick yellow or greenish drainage from your nose. Tenderness, swelling, and warmth over the affected sinuses. A cough that may get worse at night.  Decreased sense of smell and taste. Extra mucus that collects in the throat or the back of the nose (postnasal drip) causing a sore throat or bad breath. Tiredness (fatigue). Fever. How is this diagnosed? This condition is diagnosed based on: Your symptoms. Your medical history. A physical  exam. Tests to find out if your condition is acute or chronic. This may include: Checking your nose for nasal polyps. Viewing your sinuses using a device that has a light (endoscope). Testing for allergies or bacteria. Imaging tests, such as an MRI or CT scan. In rare cases, a bone biopsy may be done to rule out more serious types of fungal sinus disease. How is this treated? Treatment for a sinus infection depends on the cause and whether your condition is chronic or acute. If caused by a virus, your symptoms should go away on their own within 10 days. You may be given medicines to relieve symptoms. They include: Medicines that shrink swollen nasal passages (decongestants). A spray that eases inflammation of the nostrils (topical intranasal corticosteroids). Rinses that help get rid of thick mucus in your nose (nasal saline washes). Medicines that treat allergies (antihistamines). Over-the-counter pain relievers. If caused by bacteria, your health care provider may recommend waiting to see if your symptoms improve. Most bacterial infections will get better without antibiotic medicine. You may be given antibiotics if you have: A severe infection. A weak immune system. If caused by narrow nasal passages or nasal polyps, surgery may be needed. Follow these instructions at home: Medicines Take, use, or apply over-the-counter and prescription medicines only as told by your health care provider. These may include nasal sprays. If you were prescribed an antibiotic medicine, take it as told by your health care provider. Do not stop taking the antibiotic even if you start to feel better. Hydrate and humidify  Drink enough fluid to keep your urine pale yellow. Staying hydrated will help to thin your mucus. Use a cool mist humidifier to keep the humidity level in your home above 50%. Inhale steam for 10-15 minutes, 3-4 times a day, or as told by your health care provider. You can do this in the  bathroom while a hot shower is running. Limit your exposure to cool or dry air. Rest Rest as much as possible. Sleep with your head raised (elevated). Make sure you get enough sleep each night. General instructions  Apply a warm, moist washcloth to your face 3-4 times a day or as told by your health care provider. This will help with discomfort. Use nasal saline washes as often as told by your health care provider. Wash your hands often with soap and water to reduce your exposure to germs. If soap and water are not available, use hand sanitizer. Do not smoke. Avoid being around people who are smoking (secondhand smoke). Keep all follow-up visits. This is important. Contact a health care provider if: You have a fever. Your symptoms get worse. Your symptoms do not improve within 10 days. Get help right away if: You have a severe headache. You have persistent vomiting. You have severe pain or swelling around your face or eyes. You have vision problems. You develop confusion. Your neck is stiff. You have trouble breathing. These symptoms may be an emergency. Get help right away. Call 911. Do not wait to see if the symptoms will go away. Do not drive yourself to the hospital. Summary A sinus infection is soreness and inflammation of your sinuses. Sinuses are hollow spaces in the bones  around your face. This condition is caused by nasal tissues that become inflamed or swollen. The swelling traps or blocks the flow of mucus. This allows bacteria, viruses, and fungi to grow, which leads to infection. If you were prescribed an antibiotic medicine, take it as told by your health care provider. Do not stop taking the antibiotic even if you start to feel better. Keep all follow-up visits. This is important. This information is not intended to replace advice given to you by your health care provider. Make sure you discuss any questions you have with your health care provider. Document Revised:  04/06/2021 Document Reviewed: 04/06/2021 Elsevier Patient Education  2024 Elsevier Inc.    If you have been instructed to have an in-person evaluation today at a local Urgent Care facility, please use the link below. It will take you to a list of all of our available [facility], including address, phone number and hours of operation. Please do not delay care.  [facility]  If you or a family member do not have a primary care provider, use the link below to schedule a visit and establish care. When you choose a [facility] primary care physician or advanced practice provider, you gain a long-term partner in health. Find a Primary Care Provider  Learn more about [facility]'s in-office and virtual care options: [facility] - Get Care Now

## 2023-07-19 ENCOUNTER — Ambulatory Visit: Payer: Self-pay

## 2023-07-19 ENCOUNTER — Other Ambulatory Visit: Payer: Self-pay

## 2023-07-19 ENCOUNTER — Other Ambulatory Visit: Payer: Self-pay | Admitting: Occupational Medicine

## 2023-07-19 DIAGNOSIS — M898X5 Other specified disorders of bone, thigh: Secondary | ICD-10-CM

## 2023-07-19 DIAGNOSIS — M25552 Pain in left hip: Secondary | ICD-10-CM

## 2023-07-21 ENCOUNTER — Other Ambulatory Visit: Payer: Self-pay | Admitting: Occupational Medicine

## 2023-07-21 ENCOUNTER — Ambulatory Visit: Payer: Self-pay

## 2023-07-21 DIAGNOSIS — M898X5 Other specified disorders of bone, thigh: Secondary | ICD-10-CM

## 2023-08-10 ENCOUNTER — Other Ambulatory Visit: Payer: Self-pay | Admitting: Family Medicine

## 2023-11-01 ENCOUNTER — Encounter: Admitting: Family Medicine

## 2023-11-14 ENCOUNTER — Encounter: Admitting: Family Medicine

## 2023-12-01 ENCOUNTER — Encounter: Payer: Self-pay | Admitting: Family Medicine

## 2023-12-01 ENCOUNTER — Ambulatory Visit: Admitting: Family Medicine

## 2023-12-01 ENCOUNTER — Ambulatory Visit: Payer: Self-pay | Admitting: Family Medicine

## 2023-12-01 VITALS — BP 100/62 | HR 61 | Temp 98.3°F | Ht 63.5 in | Wt 139.2 lb

## 2023-12-01 DIAGNOSIS — Z0000 Encounter for general adult medical examination without abnormal findings: Secondary | ICD-10-CM | POA: Diagnosis not present

## 2023-12-01 LAB — CBC WITH DIFFERENTIAL/PLATELET
Basophils Absolute: 0 K/uL (ref 0.0–0.1)
Basophils Relative: 1 % (ref 0.0–3.0)
Eosinophils Absolute: 0.1 K/uL (ref 0.0–0.7)
Eosinophils Relative: 1.5 % (ref 0.0–5.0)
HCT: 39.9 % (ref 36.0–46.0)
Hemoglobin: 13.3 g/dL (ref 12.0–15.0)
Lymphocytes Relative: 35.1 % (ref 12.0–46.0)
Lymphs Abs: 1.5 K/uL (ref 0.7–4.0)
MCHC: 33.4 g/dL (ref 30.0–36.0)
MCV: 89.6 fl (ref 78.0–100.0)
Monocytes Absolute: 0.3 K/uL (ref 0.1–1.0)
Monocytes Relative: 6.4 % (ref 3.0–12.0)
Neutro Abs: 2.4 K/uL (ref 1.4–7.7)
Neutrophils Relative %: 56 % (ref 43.0–77.0)
Platelets: 179 K/uL (ref 150.0–400.0)
RBC: 4.45 Mil/uL (ref 3.87–5.11)
RDW: 12 % (ref 11.5–15.5)
WBC: 4.3 K/uL (ref 4.0–10.5)

## 2023-12-01 LAB — BASIC METABOLIC PANEL WITH GFR
BUN: 12 mg/dL (ref 6–23)
CO2: 28 meq/L (ref 19–32)
Calcium: 9 mg/dL (ref 8.4–10.5)
Chloride: 104 meq/L (ref 96–112)
Creatinine, Ser: 0.7 mg/dL (ref 0.40–1.20)
GFR: 112.51 mL/min (ref >60.00)
Glucose, Bld: 86 mg/dL (ref 70–99)
Potassium: 4.4 meq/L (ref 3.5–5.1)
Sodium: 140 meq/L (ref 135–145)

## 2023-12-01 LAB — LIPID PANEL
Cholesterol: 205 mg/dL — ABNORMAL HIGH (ref 0–200)
HDL: 107.4 mg/dL (ref >39.00)
LDL Cholesterol: 87 mg/dL (ref 0–99)
NonHDL: 97.6
Total CHOL/HDL Ratio: 2
Triglycerides: 53 mg/dL (ref 0.0–149.0)
VLDL: 10.6 mg/dL (ref 0.0–40.0)

## 2023-12-01 LAB — HEPATIC FUNCTION PANEL
ALT: 21 U/L (ref 0–35)
AST: 23 U/L (ref 0–37)
Albumin: 4 g/dL (ref 3.5–5.2)
Alkaline Phosphatase: 53 U/L (ref 39–117)
Bilirubin, Direct: 0.1 mg/dL (ref 0.0–0.3)
Total Bilirubin: 0.5 mg/dL (ref 0.2–1.2)
Total Protein: 6.3 g/dL (ref 6.0–8.3)

## 2023-12-01 LAB — HEMOGLOBIN A1C: Hgb A1c MFr Bld: 5.3 % (ref 4.6–6.5)

## 2023-12-01 LAB — TSH: TSH: 0.78 u[IU]/mL (ref 0.35–5.50)

## 2023-12-01 MED ORDER — TRIAMCINOLONE ACETONIDE 0.1 % EX CREA: 1.0000 | TOPICAL_CREAM | Freq: Three times a day (TID) | CUTANEOUS | 2 refills | Status: AC | PRN

## 2023-12-01 MED ORDER — ESCITALOPRAM OXALATE 20 MG PO TABS: ORAL_TABLET | ORAL | 3 refills | Status: DC

## 2023-12-01 NOTE — Progress Notes (Signed)
   Subjective:    Patient ID: Bonnie Gutierrez, female    DOB: 01/24/1991, 33 y.o.   MRN: [id number]  HPI Here for a well exam. She feels fine. The Lexapro  works well for her.    Review of Systems  Constitutional: Negative.   HENT: Negative.    Eyes: Negative.   Respiratory: Negative.    Cardiovascular: Negative.   Gastrointestinal: Negative.   Genitourinary:  Negative for decreased urine volume, difficulty urinating, dyspareunia, dysuria, enuresis, flank pain, frequency, hematuria, pelvic pain and urgency.  Musculoskeletal: Negative.   Skin: Negative.   Neurological: Negative.  Negative for headaches.  Psychiatric/Behavioral: Negative.         Objective:   Physical Exam Constitutional:      General: She is not in acute distress.    Appearance: Normal appearance. She is well-developed.  HENT:     Head: Normocephalic and atraumatic.     Right Ear: External ear normal.     Left Ear: External ear normal.     Nose: Nose normal.     Mouth/Throat:     Pharynx: No oropharyngeal exudate.  Eyes:     General: No scleral icterus.    Conjunctiva/sclera: Conjunctivae normal.     Pupils: Pupils are equal, round, and reactive to light.  Neck:     Thyroid : No thyromegaly.     Vascular: No JVD.  Cardiovascular:     Rate and Rhythm: Normal rate and regular rhythm.     Pulses: Normal pulses.     Heart sounds: Normal heart sounds. No murmur heard.    No friction rub. No gallop.  Pulmonary:     Effort: Pulmonary effort is normal. No respiratory distress.     Breath sounds: Normal breath sounds. No wheezing or rales.  Chest:     Chest wall: No tenderness.  Abdominal:     General: Bowel sounds are normal. There is no distension.     Palpations: Abdomen is soft. There is no mass.     Tenderness: There is no abdominal tenderness. There is no guarding or rebound.  Musculoskeletal:        General: No tenderness. Normal range of motion.     Cervical back: Normal range of motion and  neck supple.  Lymphadenopathy:     Cervical: No cervical adenopathy.  Skin:    General: Skin is warm and dry.     Findings: No erythema or rash.  Neurological:     General: No focal deficit present.     Mental Status: She is alert and oriented to person, place, and time.     Cranial Nerves: No cranial nerve deficit.     Motor: No abnormal muscle tone.     Coordination: Coordination normal.     Deep Tendon Reflexes: Reflexes are normal and symmetric. Reflexes normal.  Psychiatric:        Mood and Affect: Mood normal.        Behavior: Behavior normal.        Thought Content: Thought content normal.        Judgment: Judgment normal.           Assessment & Plan:  Well exam. We discussed diet and exercise. Get fasting labs. Garnette Olmsted, MD

## 2024-01-03 ENCOUNTER — Encounter: Payer: Self-pay | Admitting: Family Medicine

## 2024-01-03 MED ORDER — METHYLPREDNISOLONE 4 MG PO TBPK
ORAL_TABLET | ORAL | 0 refills | Status: AC
Start: 2024-01-03 — End: ?

## 2024-01-03 NOTE — Telephone Encounter (Signed)
I sent in a Medrol dose pack  

## 2024-01-03 NOTE — Telephone Encounter (Signed)
**Note De-identified  [facility] Obfuscation** Please advise 

## 2024-03-27 ENCOUNTER — Other Ambulatory Visit: Payer: Self-pay | Admitting: Family Medicine

## 2024-04-25 ENCOUNTER — Ambulatory Visit: Payer: Self-pay

## 2024-04-25 NOTE — Telephone Encounter (Signed)
 FYI Only or Action Required?: FYI only for provider: appointment scheduled on 04/26/2024 at 3:30 PM.  Patient was last seen in primary care on 12/01/2023 by Johnny Garnette LABOR, MD.  Called Nurse Triage reporting Cough.  Symptoms began several days ago.  Interventions attempted: Rest, hydration, or home remedies.  Symptoms are: unchanged.  Triage Disposition: No disposition on file.  Patient/caregiver understands and will follow disposition?:   Copied from CRM #[id number]. Topic: Clinical - Red Word Triage >> Apr 25, 2024  2:55 PM Thersia BROCKS wrote: [facility] that prompted transfer to Nurse Triage: congestion, losing her breath whenever she is talking, throat is starting to hurt    ----------------------------------------------------------------------- From previous Reason for Contact - Scheduling: Patient/patient representative is calling to schedule an appointment. Refer to attachments for appointment information. Reason for Disposition  SEVERE coughing spells (e.g., whooping sound after coughing, vomiting after coughing)  Answer Assessment - Initial Assessment Questions 1. ONSET: When did the cough begin?      Started Monday 2. SEVERITY: How bad is the cough today?      moderate 3. SPUTUM: Describe the color of your sputum (e.g., none, dry cough; clear, white, yellow, green)     green 4. HEMOPTYSIS: Are you coughing up any blood? If Yes, ask: How much? (e.g., flecks, streaks, tablespoons, etc.)     no 5. DIFFICULTY BREATHING: Are you having difficulty breathing? If Yes, ask: How bad is it? (e.g., mild, moderate, severe)      mild 6. FEVER: Do you have a fever? If Yes, ask: What is your temperature, how was it measured, and when did it start?     no 7. CARDIAC HISTORY: Do you have any history of heart disease? (e.g., heart attack, congestive heart failure)      no 8. LUNG HISTORY: Do you have any history of lung disease?  (e.g., pulmonary embolus, asthma,  emphysema)     no 9. PE RISK FACTORS: Do you have a history of blood clots? (or: recent major surgery, recent prolonged travel, bedridden)     no 10. OTHER SYMPTOMS: Do you have any other symptoms? (e.g., runny nose, wheezing, chest pain)       no 11. PREGNANCY: Is there any chance you are pregnant? When was your last menstrual period?       no 12. TRAVEL: Have you traveled out of the country in the last month? (e.g., travel history, exposures)       no  Protocols used: Cough - Acute Productive-A-AH

## 2024-04-26 ENCOUNTER — Encounter: Payer: Self-pay | Admitting: Family Medicine

## 2024-04-26 ENCOUNTER — Ambulatory Visit: Admitting: Family Medicine

## 2024-04-26 VITALS — BP 120/58 | HR 91 | Temp 99.6°F | Ht 63.5 in | Wt 142.8 lb

## 2024-04-26 DIAGNOSIS — J029 Acute pharyngitis, unspecified: Secondary | ICD-10-CM

## 2024-04-26 DIAGNOSIS — R0982 Postnasal drip: Secondary | ICD-10-CM

## 2024-04-26 DIAGNOSIS — R490 Dysphonia: Secondary | ICD-10-CM

## 2024-04-26 DIAGNOSIS — J069 Acute upper respiratory infection, unspecified: Secondary | ICD-10-CM

## 2024-04-26 DIAGNOSIS — R0981 Nasal congestion: Secondary | ICD-10-CM

## 2024-04-26 DIAGNOSIS — R051 Acute cough: Secondary | ICD-10-CM

## 2024-04-26 LAB — POC COVID19 BINAXNOW: SARS Coronavirus 2 Ag: NEGATIVE

## 2024-04-26 LAB — POCT INFLUENZA A/B
Influenza A, POC: NEGATIVE
Influenza B, POC: NEGATIVE

## 2024-04-26 NOTE — Progress Notes (Signed)
 Established Patient Office Visit   Subjective  Patient ID: Bonnie Gutierrez, female    DOB: November 04, 1990  Age: 33 y.o. MRN: [id number]  Chief Complaint  Patient presents with   Acute Visit    Patient came in today for  cough, congestion, thick and green mucous, sneezing and being hoarse, started Monday.  Patient has been taking a variety of cold medicines- nyquil, dayquil, mucinex.     Patient is a 33 year old female followed by Dr. Johnny and seen for acute concern.  Patient endorses head congestion x 5 days.  Then developed ear pressure, cough.  Hoarseness started yesterday.  Increased congestion in throat began today.  Tried taking Mucinex, Sudafed, Nettie pot, Flonase, NyQuil, DayQuil.  Patient is a runner, broadcasting/film/video which causes increased coughing and throat hoarseness d/t having to talk.  Patient denies fever, nausea, vomiting, chills, sore throat, diarrhea, facial pain/pressure.    Patient Active Problem List   Diagnosis Date Noted   Anxiety disorder 05/04/2021   COVID-19 virus infection 09/01/2020   CONCUSSION WITH NO LOSS OF CONSCIOUSNESS 02/15/2010   METRORRHAGIA 06/17/2009   GERD 04/06/2007   ACNE, MILD 04/06/2007   Past Medical History:  Diagnosis Date   Acne vulgaris    Allergy    Sulfa drugs   Anxiety 2019   Chicken pox    Depression    Dysmenorrhea    GERD (gastroesophageal reflux disease)    Routine gynecological examination    sees [facility]. Eulah Cedar in [facility]    Torn ACL (anterior cruciate ligament)    left foot Dr Vickye  2008   Past Surgical History:  Procedure Laterality Date   CESAREAN SECTION  2017 and 2015   TUBAL LIGATION  2017   Social History[1] Family History  Problem Relation Age of Onset   Allergies Other        family hx   Depression Other        family hx   Anxiety disorder Mother    Depression Mother    Miscarriages / Stillbirths Mother    Varicose Veins Mother    Cancer Maternal Grandmother    Allergies[2]  ROS Negative unless  stated above    Objective:     BP (!) 120/58 (BP Location: Left Arm, Patient Position: Sitting, Cuff Size: Normal)   Pulse 91   Temp 99.6 F (37.6 C) (Oral)   Ht 5' 3.5 (1.613 m)   Wt 142 lb 12.8 oz (64.8 kg)   SpO2 99%   BMI 24.90 kg/m  BP Readings from Last 3 Encounters:  04/26/24 (!) 120/58  12/01/23 100/62  07/25/22 98/64   Wt Readings from Last 3 Encounters:  04/26/24 142 lb 12.8 oz (64.8 kg)  12/01/23 139 lb 3.2 oz (63.1 kg)  07/25/22 127 lb (57.6 kg)      Physical Exam Constitutional:      General: She is not in acute distress.    Appearance: Normal appearance.  HENT:     Head: Normocephalic and atraumatic.     Nose: Nose normal.     Mouth/Throat:     Mouth: Mucous membranes are moist.     Pharynx: Postnasal drip present.     Comments: Hoarse voice Cardiovascular:     Rate and Rhythm: Normal rate and regular rhythm.     Heart sounds: Normal heart sounds. No murmur heard.    No gallop.  Pulmonary:     Effort: Pulmonary effort is normal. No respiratory distress.  Breath sounds: Normal breath sounds. No wheezing, rhonchi or rales.     Comments: Dry cough Skin:    General: Skin is warm and dry.  Neurological:     Mental Status: She is alert and oriented to person, place, and time.        05/04/2021   11:18 AM 07/10/2018    1:21 PM  Depression screen PHQ 2/9  Decreased Interest 1 1  Down, Depressed, Hopeless 2 0  PHQ - 2 Score 3 1  Altered sleeping 2   Tired, decreased energy 1   Change in appetite 2   Feeling bad or failure about yourself  3   Trouble concentrating 2   Moving slowly or fidgety/restless 2   Suicidal thoughts 0   PHQ-9 Score 15       Data saved with a previous flowsheet row definition       No data to display           Results for orders placed or performed in visit on 04/26/24  POC COVID-19 BinaxNow  Result Value Ref Range   SARS Coronavirus 2 Ag Negative Negative  POC Influenza A/B  Result Value Ref Range    Influenza A, POC Negative Negative   Influenza B, POC Negative Negative      Assessment & Plan:   Viral URI  Hoarseness  Acute cough -     POC COVID-19 BinaxNow -     POCT Influenza A/B  Post-nasal drainage  Acute URI sx likely viral. POC COVID and flu testing negative.  Vocal rest advised.  No ABX indicated at this time.  Continue supportive care with OTC cough/cold medication, antihistamine, Tylenol/NSAIDs, gargling with warm salt water or Chloraseptic spray, Flonase, throat lozenges, warm fluids, honey, lemon, Vicks, etc.  Return if symptoms worsen or fail to improve.   Bonnie Gutierrez Single, MD    [1]  Social History Tobacco Use   Smoking status: Never   Smokeless tobacco: Never  Substance Use Topics   Alcohol use: No    Alcohol/week: 0.0 standard drinks of alcohol   Drug use: No  [2]  Allergies Allergen Reactions   Sulfonamide Derivatives     REACTION: rash
